# Patient Record
Sex: Female | Born: 1960 | Race: White | Hispanic: No | Marital: Single | Smoking: Current every day smoker
Health system: Southern US, Community
[De-identification: ages and names within clinical notes are randomized; demographics above are authoritative.]

## PROBLEM LIST (undated history)

## (undated) DIAGNOSIS — I509 Heart failure, unspecified: Secondary | ICD-10-CM

## (undated) DIAGNOSIS — IMO0002 Reserved for concepts with insufficient information to code with codable children: Secondary | ICD-10-CM

## (undated) DIAGNOSIS — E0500 Thyrotoxicosis with diffuse goiter without thyrotoxic crisis or storm: Secondary | ICD-10-CM

## (undated) HISTORY — PX: OTHER SURGICAL HISTORY: SHX169

## (undated) HISTORY — DX: Heart failure, unspecified: I50.9

## (undated) HISTORY — PX: CATARACT EXTRACTION, BILATERAL: SHX1313

## (undated) HISTORY — PX: BUNIONECTOMY: SHX129

## (undated) HISTORY — PX: CARPAL TUNNEL RELEASE: SHX101

## (undated) HISTORY — DX: Thyrotoxicosis with diffuse goiter without thyrotoxic crisis or storm: E05.00

---

## 2000-03-10 ENCOUNTER — Encounter: Admission: RE | Admit: 2000-03-10 | Discharge: 2000-03-10 | Payer: Self-pay | Admitting: Gynecology

## 2000-03-10 ENCOUNTER — Encounter: Payer: Self-pay | Admitting: Gynecology

## 2000-04-28 ENCOUNTER — Other Ambulatory Visit: Admission: RE | Admit: 2000-04-28 | Discharge: 2000-04-28 | Payer: Self-pay | Admitting: Gynecology

## 2001-03-11 ENCOUNTER — Encounter: Admission: RE | Admit: 2001-03-11 | Discharge: 2001-03-11 | Payer: Self-pay | Admitting: Gynecology

## 2001-03-11 ENCOUNTER — Encounter: Payer: Self-pay | Admitting: Gynecology

## 2001-03-26 ENCOUNTER — Encounter: Payer: Self-pay | Admitting: Emergency Medicine

## 2001-03-26 ENCOUNTER — Emergency Department (HOSPITAL_COMMUNITY): Admission: EM | Admit: 2001-03-26 | Discharge: 2001-03-27 | Payer: Self-pay | Admitting: Emergency Medicine

## 2001-05-05 ENCOUNTER — Other Ambulatory Visit: Admission: RE | Admit: 2001-05-05 | Discharge: 2001-05-05 | Payer: Self-pay | Admitting: Gynecology

## 2001-07-24 ENCOUNTER — Ambulatory Visit (HOSPITAL_COMMUNITY): Admission: RE | Admit: 2001-07-24 | Discharge: 2001-07-24 | Payer: Self-pay | Admitting: Family Medicine

## 2001-07-24 ENCOUNTER — Encounter: Payer: Self-pay | Admitting: Family Medicine

## 2001-08-12 ENCOUNTER — Encounter: Payer: Self-pay | Admitting: Neurosurgery

## 2001-08-12 ENCOUNTER — Observation Stay (HOSPITAL_COMMUNITY): Admission: RE | Admit: 2001-08-12 | Discharge: 2001-08-13 | Payer: Self-pay | Admitting: Neurosurgery

## 2002-04-05 ENCOUNTER — Encounter: Admission: RE | Admit: 2002-04-05 | Discharge: 2002-04-05 | Payer: Self-pay | Admitting: Gynecology

## 2002-04-05 ENCOUNTER — Encounter: Payer: Self-pay | Admitting: Gynecology

## 2002-05-09 ENCOUNTER — Other Ambulatory Visit: Admission: RE | Admit: 2002-05-09 | Discharge: 2002-05-09 | Payer: Self-pay | Admitting: Gynecology

## 2002-10-04 ENCOUNTER — Emergency Department (HOSPITAL_COMMUNITY): Admission: EM | Admit: 2002-10-04 | Discharge: 2002-10-04 | Payer: Self-pay | Admitting: Emergency Medicine

## 2002-11-03 ENCOUNTER — Emergency Department (HOSPITAL_COMMUNITY): Admission: EM | Admit: 2002-11-03 | Discharge: 2002-11-03 | Payer: Self-pay | Admitting: *Deleted

## 2002-11-03 ENCOUNTER — Encounter: Payer: Self-pay | Admitting: *Deleted

## 2002-11-03 ENCOUNTER — Encounter: Payer: Self-pay | Admitting: General Surgery

## 2003-04-13 ENCOUNTER — Encounter: Payer: Self-pay | Admitting: Gynecology

## 2003-04-13 ENCOUNTER — Encounter: Admission: RE | Admit: 2003-04-13 | Discharge: 2003-04-13 | Payer: Self-pay | Admitting: Gynecology

## 2003-05-21 ENCOUNTER — Other Ambulatory Visit: Admission: RE | Admit: 2003-05-21 | Discharge: 2003-05-21 | Payer: Self-pay | Admitting: Gynecology

## 2004-04-18 ENCOUNTER — Encounter: Admission: RE | Admit: 2004-04-18 | Discharge: 2004-04-18 | Payer: Self-pay | Admitting: Gynecology

## 2004-05-21 ENCOUNTER — Other Ambulatory Visit: Admission: RE | Admit: 2004-05-21 | Discharge: 2004-05-21 | Payer: Self-pay | Admitting: Gynecology

## 2005-04-30 ENCOUNTER — Encounter: Admission: RE | Admit: 2005-04-30 | Discharge: 2005-04-30 | Payer: Self-pay | Admitting: Gynecology

## 2005-05-22 ENCOUNTER — Other Ambulatory Visit: Admission: RE | Admit: 2005-05-22 | Discharge: 2005-05-22 | Payer: Self-pay | Admitting: Gynecology

## 2005-09-25 ENCOUNTER — Encounter: Admission: RE | Admit: 2005-09-25 | Discharge: 2005-09-25 | Payer: Self-pay | Admitting: Neurosurgery

## 2005-10-09 ENCOUNTER — Encounter: Admission: RE | Admit: 2005-10-09 | Discharge: 2005-10-09 | Payer: Self-pay | Admitting: Neurosurgery

## 2005-11-18 ENCOUNTER — Encounter: Admission: RE | Admit: 2005-11-18 | Discharge: 2005-11-18 | Payer: Self-pay | Admitting: Neurosurgery

## 2006-05-05 ENCOUNTER — Encounter: Admission: RE | Admit: 2006-05-05 | Discharge: 2006-05-05 | Payer: Self-pay | Admitting: Gynecology

## 2006-06-29 ENCOUNTER — Other Ambulatory Visit: Admission: RE | Admit: 2006-06-29 | Discharge: 2006-06-29 | Payer: Self-pay | Admitting: Gynecology

## 2007-05-10 ENCOUNTER — Encounter: Admission: RE | Admit: 2007-05-10 | Discharge: 2007-05-10 | Payer: Self-pay | Admitting: Gynecology

## 2008-10-31 ENCOUNTER — Inpatient Hospital Stay (HOSPITAL_COMMUNITY): Admission: AC | Admit: 2008-10-31 | Discharge: 2008-11-06 | Payer: Self-pay

## 2008-11-02 ENCOUNTER — Ambulatory Visit: Payer: Self-pay | Admitting: Physical Medicine & Rehabilitation
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
## 2011-04-17 NOTE — H&P (Signed)
[place]  Patient:    [name], [name] Visit Number: [id number] MRN: [id number]          Service Type: Attending:  Tanya Nones. Jeral Fruit, M.D. Dictated by:   Tanya Nones. Jeral Fruit, M.D. Adm. Date:  08/12/01                           History and Physical  HISTORY OF PRESENT ILLNESS:  Ms. Baise is a lady who was seen by me about two weeks ago in my office because of back and left leg pain.  The patient complained of pain several weeks ago but unfortunately, her father was dying and she had to stay at home to help.  The patient got worse and although she had been seen by a chiropractor and taking medications, she is not any better, the pain going to the left leg and she had been out of work.  She denies any problems with the right leg or problem with bladder or bowel.  PAST MEDICAL HISTORY:  Vaginal delivery and carpal tunnel surgery.  ALLERGIES:  She is not allergic to any medication.  SOCIAL HISTORY:  She drinks socially and smokes a pack a day.  REVIEW OF SYSTEMS:  Review of systems is positive for back pain and weakness.  MEDICATIONS:  She had been taking prednisone, Xanax, hydrocodone and Vioxx.  FAMILY HISTORY:  Mother is 32 with breast cancer.  Father died of prostate cancer.  There is a history of high blood pressure and diabetes in her family.  PHYSICAL EXAMINATION:  GENERAL:  A patient who came to my office limping from the left leg and she was crying.  HEENT:  Normal.  NECK:  Normal.  LUNGS:  Clear.  HEART:  Heart sounds normal.  ABDOMEN:  Normal.  EXTREMITIES:  Normal pulses.  NEUROLOGIC:  Mental status normal.  Cranial nerves normal.  Strength is 5/5 except that in the left leg, she has weakness of dorsiflexion, 4/5. Reflexes symmetrical.  Sensation:  She complains of numbness in top of the left foot.  She has a positive sciatic notch ______ on the left side.  IMAGING STUDY:  The MRI showed that indeed she has a herniated disk  at the level of 4-5 going to the foramen, compromising the L5 nerve root.  IMPRESSION:  Left L4-L5 herniated disk.  RECOMMENDATIONS:  The patient wants to proceed with surgery.  The procedure will be an L4-L5 diskectomy using the Matrix system.  If not, we will have to proceed with a conventional surgery.  The risks of course are to a scar, infection, CSF leak, worsening of pain, paralysis and need for further surgery.  Patient declined more opinion. Dictated by:   Tanya Nones. Jeral Fruit, M.D. Attending:  Tanya Nones. Jeral Fruit, M.D. DD:  08/12/01 TD:  08/12/01 Job: [id number]

## 2011-04-17 NOTE — Op Note (Signed)
[place]  Patient:    [name], [name] Visit Number: [id number] MRN: [id number]          Service Type: MED Location: [id number] Attending Physician:  Danella Penton Dictated by:   Tanya Nones. Jeral Fruit, M.D. Proc. Date: 08/12/01 Admit Date:  08/12/2001                             Operative Report  PREOPERATIVE DIAGNOSIS:  Left L4-5 herniated disk into the foramen, affecting the L5 and L4 nerve roots.  POSTOPERATIVE DIAGNOSIS:  Left L4-5 herniated disk into the foramen, affecting the L5 and L4 nerve roots.  PROCEDURE:  Left L4-5 diskectomy, foraminotomy.  Microscope, C-arm, and Met-RX.  SURGEON:  Tanya Nones. Jeral Fruit, M.D.  ASSISTANT:  Payton Doughty, M.D.  CLINICAL HISTORY:  [name] is a 50 year old lady complaining of back and left leg pain associated with weakness.  Patient is getting worse.  Patient came to my office limping and crying.  MRI showed herniated disk at the level of L4-5 going to the foramen.  Surgery was advised.  The risks were explained in the history and physical.  DESCRIPTION OF PROCEDURE:  The patient was taken to the OR, and she was positioned in a prone manner.  The back was prepped with Betadine.  With the C-arm, we were able to localize the facet of 4-5.  Having done this, incision about one inch was made and using the Met-RX dilator, we were able to introduce the largest dilator and find the refractor of six inches deeper.  AP and lateral with the C-arm showed that indeed we were at the level of 4-5. Muscles were removed.  We identified the medial facet of 4-5 as well as the lamina of L5 and 4.  With the drill we drilled the lower part of the lamina of L5 and 4 as well as part of the medial facet.  A thick yellow ligament was also retracted.  We identified the dural sac as well as the L5 nerve root. The L5 nerve root was swollen.  Retraction was made, and indeed lateral there was a large herniated disk.  The  incision was made, and a large amount of degenerative disk medial and lateral was removed.  The procedure was done using microcurettes.  At the end, we have a good decompression of the disk. There was plenty of space of the L4 and L5 nerve root.  There was no evidence of any fragment after investigation above and below.  The only thing we found was a fragment going through ligament into L5.  That was removed at the beginning of the case.  At the end we have a good decompression.  Valsalva maneuver was negative.  Fentanyl and Depo-Medrol were left in the epidural area, and the wound was closed with Vicryl and Steri-Strip. Dictated by:   Tanya Nones. Jeral Fruit, M.D. Attending Physician:  Danella Penton DD:  08/12/01 TD:  08/12/01 Job: [phone] [id number]

## 2011-09-04 LAB — URINALYSIS, ROUTINE W REFLEX MICROSCOPIC
Bilirubin Urine: NEGATIVE
Glucose, UA: NEGATIVE mg/dL
Hgb urine dipstick: NEGATIVE
Ketones, ur: NEGATIVE mg/dL
Nitrite: NEGATIVE
Protein, ur: NEGATIVE mg/dL
Specific Gravity, Urine: 1.025 (ref 1.005–1.030)
Urobilinogen, UA: 0.2 mg/dL (ref 0.0–1.0)
pH: 6 (ref 5.0–8.0)

## 2011-09-04 LAB — CBC
HCT: 32.6 % — ABNORMAL LOW (ref 36.0–46.0)
HCT: 33.8 % — ABNORMAL LOW (ref 36.0–46.0)
HCT: 34.8 % — ABNORMAL LOW (ref 36.0–46.0)
Hemoglobin: 10.9 g/dL — ABNORMAL LOW (ref 12.0–15.0)
Hemoglobin: 11.3 g/dL — ABNORMAL LOW (ref 12.0–15.0)
Hemoglobin: 11.5 g/dL — ABNORMAL LOW (ref 12.0–15.0)
MCHC: 32.9 g/dL (ref 30.0–36.0)
MCHC: 33.5 g/dL (ref 30.0–36.0)
MCHC: 33.5 g/dL (ref 30.0–36.0)
MCV: 97.1 fL (ref 78.0–100.0)
MCV: 97.5 fL (ref 78.0–100.0)
MCV: 98.1 fL (ref 78.0–100.0)
Platelets: 181 10*3/uL (ref 150–400)
Platelets: 196 10*3/uL (ref 150–400)
Platelets: 213 10*3/uL (ref 150–400)
RBC: 3.34 MIL/uL — ABNORMAL LOW (ref 3.87–5.11)
RBC: 3.48 MIL/uL — ABNORMAL LOW (ref 3.87–5.11)
RBC: 3.55 MIL/uL — ABNORMAL LOW (ref 3.87–5.11)
RDW: 12.2 % (ref 11.5–15.5)
RDW: 12.4 % (ref 11.5–15.5)
RDW: 12.4 % (ref 11.5–15.5)
WBC: 11.1 10*3/uL — ABNORMAL HIGH (ref 4.0–10.5)
WBC: 11.2 10*3/uL — ABNORMAL HIGH (ref 4.0–10.5)
WBC: 13.6 10*3/uL — ABNORMAL HIGH (ref 4.0–10.5)

## 2011-09-04 LAB — BASIC METABOLIC PANEL
BUN: 3 mg/dL — ABNORMAL LOW (ref 6–23)
CO2: 28 mEq/L (ref 19–32)
Calcium: 8.3 mg/dL — ABNORMAL LOW (ref 8.4–10.5)
Chloride: 104 mEq/L (ref 96–112)
Creatinine, Ser: 0.54 mg/dL (ref 0.4–1.2)
GFR calc Af Amer: >60 mL/min (ref >60)
GFR calc non Af Amer: >60 mL/min (ref >60)
Glucose, Bld: 113 mg/dL — ABNORMAL HIGH (ref 70–99)
Potassium: 3.1 mEq/L — ABNORMAL LOW (ref 3.5–5.1)
Sodium: 138 mEq/L (ref 135–145)

## 2011-09-04 LAB — ETHANOL: Alcohol, Ethyl (B): <5 mg/dL (ref 0–10)

## 2011-09-04 LAB — DIFFERENTIAL
Basophils Absolute: 0 10*3/uL (ref 0.0–0.1)
Basophils Relative: 0 % (ref 0–1)
Eosinophils Absolute: 0.2 10*3/uL (ref 0.0–0.7)
Eosinophils Relative: 1 % (ref 0–5)
Lymphocytes Relative: 36 % (ref 12–46)
Lymphs Abs: 4 10*3/uL (ref 0.7–4.0)
Monocytes Absolute: 0.6 10*3/uL (ref 0.1–1.0)
Monocytes Relative: 5 % (ref 3–12)
Neutro Abs: 6.4 10*3/uL (ref 1.7–7.7)
Neutrophils Relative %: 58 % (ref 43–77)

## 2011-09-04 LAB — RAPID URINE DRUG SCREEN, HOSP PERFORMED
Amphetamines: NOT DETECTED
Barbiturates: NOT DETECTED
Benzodiazepines: NOT DETECTED
Cocaine: NOT DETECTED
Opiates: POSITIVE — AB
Tetrahydrocannabinol: NOT DETECTED

## 2011-09-04 LAB — POCT I-STAT, CHEM 8
BUN: 8 mg/dL (ref 6–23)
Calcium, Ion: 0.99 mmol/L — ABNORMAL LOW (ref 1.12–1.32)
Chloride: 112 mEq/L (ref 96–112)
Creatinine, Ser: 0.7 mg/dL (ref 0.4–1.2)
Glucose, Bld: 81 mg/dL (ref 70–99)
HCT: 34 % — ABNORMAL LOW (ref 36.0–46.0)
Hemoglobin: 11.6 g/dL — ABNORMAL LOW (ref 12.0–15.0)
Potassium: 2.6 meq/L — CL (ref 3.5–5.1)
Sodium: 145 mEq/L (ref 135–145)
TCO2: 19 mmol/L (ref 0–100)

## 2014-07-10 ENCOUNTER — Emergency Department (HOSPITAL_COMMUNITY)
Admission: EM | Admit: 2014-07-10 | Discharge: 2014-07-10 | Disposition: A | Discharge status: Home / Self Care | Payer: Commercial Managed Care - PPO | Attending: Emergency Medicine | Admitting: Emergency Medicine

## 2014-07-10 ENCOUNTER — Encounter (HOSPITAL_COMMUNITY): Payer: Self-pay | Admitting: Emergency Medicine

## 2014-07-10 DIAGNOSIS — Y92410 Unspecified street and highway as the place of occurrence of the external cause: Secondary | ICD-10-CM | POA: Insufficient documentation

## 2014-07-10 DIAGNOSIS — G8929 Other chronic pain: Secondary | ICD-10-CM | POA: Diagnosis not present

## 2014-07-10 DIAGNOSIS — S39012A Strain of muscle, fascia and tendon of lower back, initial encounter: Secondary | ICD-10-CM

## 2014-07-10 DIAGNOSIS — S335XXA Sprain of ligaments of lumbar spine, initial encounter: Secondary | ICD-10-CM | POA: Diagnosis not present

## 2014-07-10 DIAGNOSIS — Y9389 Activity, other specified: Secondary | ICD-10-CM | POA: Insufficient documentation

## 2014-07-10 DIAGNOSIS — IMO0002 Reserved for concepts with insufficient information to code with codable children: Secondary | ICD-10-CM | POA: Diagnosis present

## 2014-07-10 DIAGNOSIS — M549 Dorsalgia, unspecified: Secondary | ICD-10-CM

## 2014-07-10 HISTORY — DX: Reserved for concepts with insufficient information to code with codable children: IMO0002

## 2014-07-10 MED ORDER — CYCLOBENZAPRINE HCL 10 MG PO TABS: 10.0000 mg | ORAL_TABLET | Freq: Three times a day (TID) | ORAL | Status: DC | PRN

## 2014-07-10 MED ORDER — HYDROCODONE-ACETAMINOPHEN 5-325 MG PO TABS: 1.0000 | ORAL_TABLET | ORAL | Status: DC | PRN

## 2014-07-10 MED ORDER — MELOXICAM 15 MG PO TABS: 15.0000 mg | ORAL_TABLET | Freq: Every day | ORAL | Status: DC
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
## 2014-07-17 NOTE — ED Provider Notes (Signed)
Medical screening examination/treatment/procedure(s) were performed by non-physician practitioner and as supervising physician I was immediately available for consultation/collaboration.     Suzi RootsKevin E Thorn Demas, MD 07/17/14 [phone]

## 2017-02-22 ENCOUNTER — Ambulatory Visit (HOSPITAL_COMMUNITY)
Admission: EM | Admit: 2017-02-22 | Discharge: 2017-02-22 | Disposition: A | Discharge status: Other Acute Inpatient Hospital | Payer: Managed Care, Other (non HMO)

## 2017-02-23 ENCOUNTER — Inpatient Hospital Stay (HOSPITAL_COMMUNITY)
Admission: EM | Admit: 2017-02-23 | Discharge: 2017-02-26 | DRG: 291 | Disposition: A | Discharge status: Home / Self Care | Payer: Managed Care, Other (non HMO) | Attending: Family Medicine | Admitting: Family Medicine

## 2017-02-23 ENCOUNTER — Emergency Department (HOSPITAL_COMMUNITY): Payer: Managed Care, Other (non HMO)

## 2017-02-23 ENCOUNTER — Encounter (HOSPITAL_COMMUNITY): Payer: Self-pay

## 2017-02-23 DIAGNOSIS — R0600 Dyspnea, unspecified: Secondary | ICD-10-CM

## 2017-02-23 DIAGNOSIS — J180 Bronchopneumonia, unspecified organism: Secondary | ICD-10-CM | POA: Diagnosis not present

## 2017-02-23 DIAGNOSIS — J189 Pneumonia, unspecified organism: Secondary | ICD-10-CM

## 2017-02-23 DIAGNOSIS — E785 Hyperlipidemia, unspecified: Secondary | ICD-10-CM | POA: Diagnosis present

## 2017-02-23 DIAGNOSIS — E876 Hypokalemia: Secondary | ICD-10-CM | POA: Diagnosis present

## 2017-02-23 DIAGNOSIS — R05 Cough: Secondary | ICD-10-CM | POA: Diagnosis not present

## 2017-02-23 DIAGNOSIS — R600 Localized edema: Secondary | ICD-10-CM

## 2017-02-23 DIAGNOSIS — J302 Other seasonal allergic rhinitis: Secondary | ICD-10-CM | POA: Diagnosis present

## 2017-02-23 DIAGNOSIS — I509 Heart failure, unspecified: Secondary | ICD-10-CM | POA: Diagnosis not present

## 2017-02-23 DIAGNOSIS — B9729 Other coronavirus as the cause of diseases classified elsewhere: Secondary | ICD-10-CM | POA: Diagnosis present

## 2017-02-23 DIAGNOSIS — F17200 Nicotine dependence, unspecified, uncomplicated: Secondary | ICD-10-CM | POA: Diagnosis present

## 2017-02-23 DIAGNOSIS — I5021 Acute systolic (congestive) heart failure: Secondary | ICD-10-CM | POA: Diagnosis not present

## 2017-02-23 DIAGNOSIS — J440 Chronic obstructive pulmonary disease with acute lower respiratory infection: Secondary | ICD-10-CM | POA: Diagnosis present

## 2017-02-23 DIAGNOSIS — I700 Atherosclerosis of aorta: Secondary | ICD-10-CM | POA: Diagnosis present

## 2017-02-23 DIAGNOSIS — I5023 Acute on chronic systolic (congestive) heart failure: Secondary | ICD-10-CM | POA: Diagnosis not present

## 2017-02-23 DIAGNOSIS — J441 Chronic obstructive pulmonary disease with (acute) exacerbation: Secondary | ICD-10-CM | POA: Diagnosis present

## 2017-02-23 DIAGNOSIS — R748 Abnormal levels of other serum enzymes: Secondary | ICD-10-CM

## 2017-02-23 DIAGNOSIS — R7989 Other specified abnormal findings of blood chemistry: Secondary | ICD-10-CM

## 2017-02-23 DIAGNOSIS — J9601 Acute respiratory failure with hypoxia: Secondary | ICD-10-CM | POA: Diagnosis present

## 2017-02-23 DIAGNOSIS — R042 Hemoptysis: Secondary | ICD-10-CM | POA: Diagnosis present

## 2017-02-23 DIAGNOSIS — E010 Iodine-deficiency related diffuse (endemic) goiter: Secondary | ICD-10-CM | POA: Diagnosis present

## 2017-02-23 DIAGNOSIS — R0609 Other forms of dyspnea: Secondary | ICD-10-CM

## 2017-02-23 DIAGNOSIS — R059 Cough, unspecified: Secondary | ICD-10-CM

## 2017-02-23 DIAGNOSIS — R0602 Shortness of breath: Secondary | ICD-10-CM | POA: Diagnosis not present

## 2017-02-23 DIAGNOSIS — J439 Emphysema, unspecified: Secondary | ICD-10-CM

## 2017-02-23 DIAGNOSIS — B342 Coronavirus infection, unspecified: Secondary | ICD-10-CM

## 2017-02-23 DIAGNOSIS — J181 Lobar pneumonia, unspecified organism: Secondary | ICD-10-CM

## 2017-02-23 DIAGNOSIS — D649 Anemia, unspecified: Secondary | ICD-10-CM | POA: Diagnosis not present

## 2017-02-23 DIAGNOSIS — Z888 Allergy status to other drugs, medicaments and biological substances status: Secondary | ICD-10-CM | POA: Diagnosis not present

## 2017-02-23 LAB — BASIC METABOLIC PANEL
Anion gap: 9 (ref 5–15)
BUN: <5 mg/dL — ABNORMAL LOW (ref 6–20)
CO2: 30 mmol/L (ref 22–32)
Calcium: 8.7 mg/dL — ABNORMAL LOW (ref 8.9–10.3)
Chloride: 103 mmol/L (ref 101–111)
Creatinine, Ser: 0.37 mg/dL — ABNORMAL LOW (ref 0.44–1.00)
GFR calc Af Amer: >60 mL/min (ref >60)
GFR calc non Af Amer: >60 mL/min (ref >60)
Glucose, Bld: 135 mg/dL — ABNORMAL HIGH (ref 65–99)
Potassium: 2.7 mmol/L — CL (ref 3.5–5.1)
Sodium: 142 mmol/L (ref 135–145)

## 2017-02-23 LAB — LIPID PANEL
Cholesterol: 68 mg/dL (ref 0–200)
HDL: 22 mg/dL — ABNORMAL LOW (ref >40)
LDL Cholesterol: 31 mg/dL (ref 0–99)
Total CHOL/HDL Ratio: 3.1 RATIO
Triglycerides: 75 mg/dL (ref <150)
VLDL: 15 mg/dL (ref 0–40)

## 2017-02-23 LAB — COMPREHENSIVE METABOLIC PANEL
ALT: 15 U/L (ref 14–54)
AST: 24 U/L (ref 15–41)
Albumin: 3.1 g/dL — ABNORMAL LOW (ref 3.5–5.0)
Alkaline Phosphatase: 170 U/L — ABNORMAL HIGH (ref 38–126)
Anion gap: 10 (ref 5–15)
BUN: 7 mg/dL (ref 6–20)
CO2: 28 mmol/L (ref 22–32)
Calcium: 8.8 mg/dL — ABNORMAL LOW (ref 8.9–10.3)
Chloride: 104 mmol/L (ref 101–111)
Creatinine, Ser: 0.32 mg/dL — ABNORMAL LOW (ref 0.44–1.00)
GFR calc Af Amer: >60 mL/min (ref >60)
GFR calc non Af Amer: >60 mL/min (ref >60)
Glucose, Bld: 101 mg/dL — ABNORMAL HIGH (ref 65–99)
Potassium: 2.4 mmol/L — CL (ref 3.5–5.1)
Sodium: 142 mmol/L (ref 135–145)
Total Bilirubin: 1.2 mg/dL (ref 0.3–1.2)
Total Protein: 5.6 g/dL — ABNORMAL LOW (ref 6.5–8.1)

## 2017-02-23 LAB — CBC WITH DIFFERENTIAL/PLATELET
Basophils Absolute: 0 10*3/uL (ref 0.0–0.1)
Basophils Relative: 0 %
Eosinophils Absolute: 0 10*3/uL (ref 0.0–0.7)
Eosinophils Relative: 0 %
HCT: 28.6 % — ABNORMAL LOW (ref 36.0–46.0)
Hemoglobin: 9.1 g/dL — ABNORMAL LOW (ref 12.0–15.0)
Lymphocytes Relative: 52 %
Lymphs Abs: 3.7 10*3/uL (ref 0.7–4.0)
MCH: 29.1 pg (ref 26.0–34.0)
MCHC: 31.8 g/dL (ref 30.0–36.0)
MCV: 91.4 fL (ref 78.0–100.0)
Monocytes Absolute: 0.7 10*3/uL (ref 0.1–1.0)
Monocytes Relative: 10 %
Neutro Abs: 2.7 10*3/uL (ref 1.7–7.7)
Neutrophils Relative %: 38 %
Platelets: 150 10*3/uL (ref 150–400)
RBC: 3.13 MIL/uL — ABNORMAL LOW (ref 3.87–5.11)
RDW: 15.2 % (ref 11.5–15.5)
WBC: 7.2 10*3/uL (ref 4.0–10.5)

## 2017-02-23 LAB — URINALYSIS, ROUTINE W REFLEX MICROSCOPIC
Bilirubin Urine: NEGATIVE
Glucose, UA: NEGATIVE mg/dL
Hgb urine dipstick: NEGATIVE
Ketones, ur: NEGATIVE mg/dL
Leukocytes, UA: NEGATIVE
Nitrite: NEGATIVE
Protein, ur: NEGATIVE mg/dL
Specific Gravity, Urine: 1.01 (ref 1.005–1.030)
pH: 7.5 (ref 5.0–8.0)

## 2017-02-23 LAB — TROPONIN I
Troponin I: <0.03 ng/mL (ref <0.03)
Troponin I: <0.03 ng/mL (ref <0.03)

## 2017-02-23 LAB — TSH: TSH: <0.01 u[IU]/mL — ABNORMAL LOW (ref 0.350–4.500)

## 2017-02-23 LAB — MAGNESIUM: Magnesium: 1.6 mg/dL — ABNORMAL LOW (ref 1.7–2.4)

## 2017-02-23 LAB — BRAIN NATRIURETIC PEPTIDE: B Natriuretic Peptide: 1346.4 pg/mL — ABNORMAL HIGH (ref 0.0–100.0)

## 2017-02-23 MED ORDER — ACETAMINOPHEN 650 MG RE SUPP: 650.0000 mg | Freq: Four times a day (QID) | RECTAL | Status: DC | PRN

## 2017-02-23 MED ORDER — DEXTROSE 5 % IV SOLN
1.0000 g | Freq: Once | INTRAVENOUS | Status: AC
  Administered 2017-02-23: 1 g via INTRAVENOUS
  Filled 2017-02-23: qty 10

## 2017-02-23 MED ORDER — DEXTROSE 5 % IV SOLN
1.0000 g | INTRAVENOUS | Status: DC
  Administered 2017-02-24: 1 g via INTRAVENOUS
  Filled 2017-02-23 (×2): qty 10

## 2017-02-23 MED ORDER — DOCUSATE SODIUM 100 MG PO CAPS
100.0000 mg | ORAL_CAPSULE | Freq: Two times a day (BID) | ORAL | Status: DC
  Administered 2017-02-23: 100 mg via ORAL
  Filled 2017-02-23 (×5): qty 1

## 2017-02-23 MED ORDER — POTASSIUM CHLORIDE CRYS ER 20 MEQ PO TBCR
40.0000 meq | EXTENDED_RELEASE_TABLET | Freq: Once | ORAL | Status: AC
  Administered 2017-02-23: 40 meq via ORAL
  Filled 2017-02-23: qty 2

## 2017-02-23 MED ORDER — IOPAMIDOL (ISOVUE-370) INJECTION 76%
INTRAVENOUS | Status: AC
  Administered 2017-02-23: 100 mL
  Filled 2017-02-23: qty 100

## 2017-02-23 MED ORDER — LORATADINE 10 MG PO TABS
10.0000 mg | ORAL_TABLET | Freq: Every day | ORAL | Status: DC
  Administered 2017-02-24 – 2017-02-26 (×3): 10 mg via ORAL
  Filled 2017-02-23 (×3): qty 1

## 2017-02-23 MED ORDER — DEXTROSE 5 % IV SOLN
500.0000 mg | INTRAVENOUS | Status: DC
  Administered 2017-02-24: 500 mg via INTRAVENOUS
  Filled 2017-02-23 (×2): qty 500

## 2017-02-23 MED ORDER — ACETAMINOPHEN 325 MG PO TABS: 650.0000 mg | ORAL_TABLET | Freq: Four times a day (QID) | ORAL | Status: DC | PRN

## 2017-02-23 MED ORDER — ENOXAPARIN SODIUM 40 MG/0.4ML ~~LOC~~ SOLN
40.0000 mg | SUBCUTANEOUS | Status: DC
  Administered 2017-02-25: 40 mg via SUBCUTANEOUS
  Filled 2017-02-23 (×2): qty 0.4

## 2017-02-23 MED ORDER — DEXTROSE 5 % IV SOLN
500.0000 mg | Freq: Once | INTRAVENOUS | Status: AC
  Administered 2017-02-23: 500 mg via INTRAVENOUS
  Filled 2017-02-23: qty 500

## 2017-02-23 MED ORDER — FAMOTIDINE 20 MG PO TABS
10.0000 mg | ORAL_TABLET | Freq: Every day | ORAL | Status: DC
  Administered 2017-02-24 – 2017-02-26 (×3): 10 mg via ORAL
  Filled 2017-02-23 (×3): qty 1

## 2017-02-23 MED ORDER — POTASSIUM CHLORIDE CRYS ER 20 MEQ PO TBCR
40.0000 meq | EXTENDED_RELEASE_TABLET | Freq: Three times a day (TID) | ORAL | Status: AC
  Administered 2017-02-23 – 2017-02-24 (×2): 40 meq via ORAL
  Filled 2017-02-23 (×2): qty 2

## 2017-02-23 MED ORDER — FUROSEMIDE 10 MG/ML IJ SOLN
40.0000 mg | Freq: Once | INTRAMUSCULAR | Status: AC
Start: 2017-02-23 — End: 2017-02-23
  Administered 2017-02-23: 40 mg via INTRAVENOUS
  Filled 2017-02-23: qty 4

## 2017-02-23 MED ORDER — IPRATROPIUM-ALBUTEROL 0.5-2.5 (3) MG/3ML IN SOLN
3.0000 mL | RESPIRATORY_TRACT | Status: DC
  Administered 2017-02-23 – 2017-02-24 (×3): 3 mL via RESPIRATORY_TRACT
  Filled 2017-02-23 (×3): qty 3

## 2017-02-23 MED ORDER — ALBUTEROL SULFATE (2.5 MG/3ML) 0.083% IN NEBU: 2.5000 mg | INHALATION_SOLUTION | RESPIRATORY_TRACT | Status: DC | PRN

## 2017-02-23 MED ORDER — SODIUM CHLORIDE 0.9% FLUSH
3.0000 mL | Freq: Two times a day (BID) | INTRAVENOUS | Status: DC
  Administered 2017-02-23 – 2017-02-26 (×6): 3 mL via INTRAVENOUS

## 2017-02-23 MED ORDER — SODIUM CHLORIDE 0.9 % IV SOLN
30.0000 meq | Freq: Once | INTRAVENOUS | Status: AC
  Administered 2017-02-23: 30 meq via INTRAVENOUS
  Filled 2017-02-23: qty 15

## 2017-02-23 MED ORDER — PROMETHAZINE HCL 25 MG PO TABS: 12.5000 mg | ORAL_TABLET | Freq: Four times a day (QID) | ORAL | Status: DC | PRN

## 2017-02-23 NOTE — ED Notes (Signed)
Patient is stable and ready to be transport to the floor at this time.  Report was called to 3E RN.  Belongings taken with the patient to the floor.   

## 2017-02-23 NOTE — Progress Notes (Signed)
CRITICAL VALUE ALERT  Critical value received:  Potassium 2.7  Date of notification:  02/23/2017  Time of notification:  1953  Critical value read back: Yes  Nurse who received alert:  Petra KubaErica Seaborn Nakama  MD notified (1st page):  Family Medicine Pager  Time of first page:  1954  Time MD responded:  2005

## 2017-02-23 NOTE — ED Notes (Signed)
Notified Ebbie Ridgehris Lawyer, [place]PA of K of 2.4

## 2017-02-23 NOTE — ED Triage Notes (Signed)
Pt states increased shortness of breath for several days. She also has notable swelling bilaterally to the feet. Pt shortness of breath worse with exertion. EKG done. SPO2 98%

## 2017-02-23 NOTE — H&P (Signed)
[facility] Admission History and Physical Service Pager: [phone]  Patient name: [name] Medical record number: [id number] Date of birth: [birth year] Age: 56 y.o. Gender: female  Primary Care Provider: No PCP Per Patient Consultants: None  Code Status: FULL   Chief Complaint: shortness of breath   Assessment and Plan: LASHEENA FRIEZE is a 56 y.o. female presenting with shortness of breath.  With no known significant PMHx but has not seen a doctor in >10 years.    Shortness of breath: Likely 2/2 new developing CHF.  Patient with new onset shortness of breath on exertion x 1 week with associated bilateral leg swelling.  On arrival to ED, VSS and afebrile with no white count.  Satting 100% on RA.  Labs notable for hypokalemia to 2.4, and elevated alk phos of 170 but otherwise unremarkable. UA with no findings concerning for infection.   Also with associated productive cough occasionally streaked with blood.  No fevers at home and has only taken Mucinex for this cough.  CXR revealed pulmonary vascular congestion with peripheral wedge-shaped opacity in RUL.  Also with apical parenchymal changes and minimal nodularity in right suprahilar region.  In setting of history of smoking, chest CT recommended to determine if underlying malignancy.  CTA was ordered which revealed no evidence of acute PE but did show cardiomegaly, possible mild interstitial edema with trace fusions and RUL peribronchial nodularity in confluent ground-glass opacity most likely bronchopneumonia. Concern for infiltrate vs malignancy due to significant smoking history.  Discussed case with Dr. Nevada Crane (Radiology) regarding if CTA would rule out malignancy and if CT chest should be obtained.  Noted that unlikely malignancy and should treat bronchopneumonia and fluid overload for now with follow up CXR in 3 weeks to check if opacity has resolved.  Differentials include new CHF vs PE.  PE unlikely given Wells  score 0 , no history of long period of immobilization or DVT and negative CTA obtained in ED.  ACS unlikely given no chest pain but must be ruled out given female with new shortness of breath and signs of fluid overload.  EKG with Q waves in II, III, V5 and V6 and some LA enlargement but no acute ST segment changes.  Leading differential is new CHF in setting of BNP elevated to 1346, dyspnea on exertion and obvious signs of fluid overload on exam.  Orthopnea and PND is also concerning for CHF. No prior echo obtained.  Patient also likely has component of underlying lung disease given her substantial tobacco use history. Previous imaging dating back to 2009 has shown emphysematous changes.  -Admit to telemetry, attending Dr. Nori Riis  -Continuous cardiac monitoring -Continuous pulse ox  -Give Lasix 40 mg x1 now and re-assess need for further diuresis -AM BMET  -AM EKG  -Trend troponins  -Echocardiogram  -Risk stratification labs ordered: TSH, A1c, lipid panel  -Scheduled duonebs Q4 -Albuterol neb Q1 prn  -RVP pending -droplet precaution  -precautions -Sputum cx  -Monitor fluid status -Tylenol 650 mg Q6 prn -Phenergan Q6 prn   -I's and O's  -Daily weights -would likely benefit from PFTs outpatient  -hold off on prednisone for now given potential for volume retention in the setting of attempted diuresis; re-evaluate in AM or if respiratory status worsens   RUL PNA on CTA.  Patient afebrile and with no white count.  Started on CTX and AZT in ED.  Will need follow up CXR in 3 weeks to check for resolution of opacity  -  Continue antibiotics  -monitor for fevers -Will obtain sputum cx given concern for post obstructive pneumonia.   -obtain pro-calcitonin and urinary antigens   Hypokalemia.  On arrival, K+ of 2.4.  Given K-run 30 mEq + 40 K-dur x1 in ED.  -Recheck BMET at [place] given risk for worsened hypokalemia in setting of diuretic use  -Repeat AM BMET  -replete as necessary  Anemia:  Normocytic. Hgb 9.1 at admission. Prior values in 2009 around 11. Patient endorses some minimal blood streaked sputum but no active bleeding present at admission. Given patient has not been followed medically for several years, is likely not up to date on cancer screenings.  -repeat CBC in AM  -iron panel  -FOBT -discuss cancer screening and malignancy symptoms with patient in AM   Elevated Alk Phos: 170 at admission. No prior value available for comparison. LFTs WNL so less likely to be hepatic in nature. May be related to Vit D deficiency as patient reports history of this. Potentially related to thyroid disorder. Parathyroid disorder less likely given corrected Ca for low albumin within normal range.  -Vit D, TSH, and GGT ordered   Atherosclerosis: Present in aorta on CTA chest at admission. CT abdomen from 2009 revealed moderate abdominal aortic atherosclerosis, somewhat advanced for age. Patient would likely benefit from high intensity statin given these findings and history of tobacco use.  -obtain lipid panel for further risk stratification   Thyromegaly: Present on CTA chest. New finding since previous imaging in 2009.  -TSH, will order T3/T4 if abnormal   Prolonged QT .  On EKG obtained in ED noted to be 470.   -Avoid medications that prolong QT   Seasonal allergies:  At home on Claritin.  -Continue Claritin 10 mg daily    FEN/GI: Regular, SLIV Prophylaxis: Lovenox  Disposition: Admit to telemetry, attending Dr. Nori Riis   History of Present Illness:  [name] is a 56 y.o. female presenting with shortness of breath   Review Of Systems: Per HPI with the following additions:   Complaining of bilateral legs and feet swelling for past few months. At first leg swelling would go down but now it is persisting.  Has also had a cough and headache. SOB worse with exertion. Has been occurring over the past week. Has tried Mucinex at home. Does not think she has ever had an echo done.  Does not have a regular PCP and has not been to the doctors "in years".   Has cut down to ten cigarettes per day because she couldn't breathe but used to be over a pack a day. Cough has been productive with brown sputum and some streaking of blood present.  Afebrile at home. +Rhinorrhea. Has been waking up in the middle of the night with SOB and has been sleeping in recliner in the middle of the night to help her breathe.   Review of Systems  Constitutional: Negative for chills, fever and weight loss.  HENT: Negative for congestion and sore throat.   Respiratory: Positive for cough, sputum production and shortness of breath.   Cardiovascular: Positive for orthopnea and leg swelling. Negative for chest pain.  Neurological: Negative for weakness.   Patient Active Problem List   Diagnosis Date Noted  . Shortness of breath 02/23/2017  . Elevated brain natriuretic peptide (BNP) level 02/23/2017  . Bronchopneumonia 02/23/2017  . Lower extremity edema 02/23/2017  . Anemia 02/23/2017  . Elevated alkaline phosphatase level 02/23/2017  . Acute systolic congestive heart failure ([place])   .  Community acquired pneumonia of right upper lobe of lung ([place])   . Hypokalemia    Past Medical History: Past Medical History:  Diagnosis Date  . Herniated disc    Past Surgical History: History reviewed. No pertinent surgical history.  Social History: Social History  Substance Use Topics  . Smoking status: Current Every Day Smoker  . Smokeless tobacco: Never Used  . Alcohol use No   Additional social history: Long smoking history. No etoh or drug use. Has custody of 18 month grandson.  Please also refer to relevant sections of EMR.  Family History: History reviewed. No pertinent family history.  Allergies and Medications: Allergies  Allergen Reactions  . Other Rash    Per pt medication to treat MRSA - unsure of name    No current facility-administered medications on file prior to encounter.     Current Outpatient Prescriptions on File Prior to Encounter  Medication Sig Dispense Refill  . cyclobenzaprine (FLEXERIL) 10 MG tablet Take 1 tablet (10 mg total) by mouth 3 (three) times daily as needed for muscle spasms. (Patient not taking: Reported on 02/23/2017) 30 tablet 0  . HYDROcodone-acetaminophen (NORCO/VICODIN) 5-325 MG per tablet Take 1 tablet by mouth every 4 (four) hours as needed for moderate pain. (Patient not taking: Reported on 02/23/2017) 10 tablet 0  . meloxicam (MOBIC) 15 MG tablet Take 1 tablet (15 mg total) by mouth daily. (Patient not taking: Reported on 02/23/2017) 20 tablet 0   Objective: BP (!) 146/57 (BP Location: Right Arm)   Pulse (!) 102   Temp 98.8 F (37.1 C) (Oral)   Resp 18   Wt 135 lb (61.2 kg)   SpO2 94%  Exam: General: 56 year old female sitting in hospital bed, in NAD, thin and somewhat chronically ill appearing  Eyes: PERRL, EOMI ENTM: MMM, oropharynx clear Neck: supple, no JVD  Cardiovascular: RRR no MRG, palpable pulses Respiratory: poor air movement noted on exam, decreased breath sounds over right lung, scattered wheezing, mild increase in work of breathing on RA, speaking in full sentences  Gastrointestinal: soft, NT, ND, +bs MSK: ROM normal Derm: skin is warm and dry Neuro: AAOx3, no focal deficits, 5/5 strength in upper and lower extremities bilaterally Psych: normal affect   Labs and Imaging: CBC BMET   Recent Labs Lab 02/23/17 0930  WBC 7.2  HGB 9.1*  HCT 28.6*  PLT 150    Recent Labs Lab 02/23/17 0930  NA 142  K 2.4*  CL 104  CO2 28  BUN 7  CREATININE 0.32*  GLUCOSE 101*  CALCIUM 8.8*     Dg Chest 2 View  Result Date: 02/23/2017 CLINICAL DATA:  56 year old female with shortness of breath. Smoker. Initial encounter. EXAM: CHEST  2 VIEW COMPARISON:  None. FINDINGS: Biapical changes suggestion of a bullae and scarring. Underlying mass (particularly right lung apex) cannot be excluded. Fullness right hilum possibly  vascular in origin. Mass not excluded. Pulmonary vascular congestion most prominent centrally. Fissure thickening and Kerley B lines may represent result of mild congestive heart. Peripheral wedge-shaped opacity right upper lobe. Subtle infiltrate not excluded. Heart size top-normal. Calcified slightly tortuous aorta. No acute osseous abnormality. IMPRESSION: Pulmonary vascular congestion most prominent centrally. Fissure thickening and Kerley B lines may represent result of mild congestive heart. Peripheral wedge-shaped opacity right upper lobe. Subtle infiltrate not excluded. Apical parenchymal changes, minimal nodularity right suprahilar region and slightly nodular appearance of right hilum. Given patient's history of smoking and lack of comparisons, chest CT recommended  to determine if there is an underlying malignancy contributing to findings. Aortic atherosclerosis. Electronically Signed   By: Genia Del M.D.   On: 02/23/2017 09:05   Ct Angio Chest Pe W/cm &/or Wo Cm  Result Date: 02/23/2017 CLINICAL DATA:  55 year old female with shortness of breath and leg swelling for 1 week. Chest radiographs today suggesting interstitial edema but also with nonspecific wedge-shaped opacity. EXAM: CT ANGIOGRAPHY CHEST WITH CONTRAST TECHNIQUE: Multidetector CT imaging of the chest was performed using the standard protocol during bolus administration of intravenous contrast. Multiplanar CT image reconstructions and MIPs were obtained to evaluate the vascular anatomy. CONTRAST:  100 mL Isovue 370 COMPARISON:  Chest radiographs 0846 hours today. Chest CT 10/31/2008. FINDINGS: Cardiovascular: Adequate contrast bolus timing in the pulmonary arterial tree. Mild respiratory motion artifact. No focal filling defect identified in the pulmonary arteries to suggest acute pulmonary embolism. Mild cardiomegaly. No pericardial effusion. Mild Calcified aortic atherosclerosis. Mediastinum/Nodes: Thyromegaly, new since 2009. No  airway narrowing at the thoracic inlet. No mediastinal or hilar lymphadenopathy, although there is suggestion of some generalized mediastinal soft tissue edema. Lungs/Pleura: Major airways are patent except for atelectatic changes at the hila. Chronic upper lobe paraseptal and centrilobular emphysema and confluent apical scarring. Nodular peribronchial opacity in the posterior right upper lobe with confluent ground-glass opacity along the minor fissure. No other confluent pulmonary opacity. Possible trace pleural effusions. Upper Abdomen: Negative. Musculoskeletal: Mild upper thoracic superior endplate cone cavity appears chronic. No acute osseous abnormality identified. Review of the MIP images confirms the above findings. IMPRESSION: 1.  No evidence of acute pulmonary embolus. 2. There is cardiomegaly - and possibly mild interstitial edema with trace fusions - but right upper lobe peribronchial nodularity in confluent ground-glass opacity most resembles acute Bronchopneumonia. 3. Underlying upper lobe emphysema. 4. Mild Calcified aortic atherosclerosis. 5. Thyromegaly, new since 2009.  Query thyroid hormone abnormality. Electronically Signed   By: Genevie Ann M.D.   On: 02/23/2017 11:12    Lovenia Kim, MD 02/23/2017, 5:44 PM PGY-1, [place] Intern pager: [phone], text pages welcome  Upper Level Addendum:  I have seen and evaluated this patient along with Dr. Reesa Chew and reviewed the above note, making necessary revisions in green.   Phill Myron, D.O. 02/23/2017, 6:25 PM PGY-2, [place]

## 2017-02-23 NOTE — Progress Notes (Signed)
Pt stated she is spitting up tan sputum. Sputum collection cup given to pt and explained.  Sharnetta Gielow Elige RadonBradley

## 2017-02-24 ENCOUNTER — Inpatient Hospital Stay (HOSPITAL_COMMUNITY): Payer: Managed Care, Other (non HMO)

## 2017-02-24 ENCOUNTER — Other Ambulatory Visit (HOSPITAL_COMMUNITY): Payer: Commercial Managed Care - PPO

## 2017-02-24 LAB — GAMMA GT: GGT: 78 U/L — ABNORMAL HIGH (ref 7–50)

## 2017-02-24 LAB — CBC
HCT: 27.5 % — ABNORMAL LOW (ref 36.0–46.0)
Hemoglobin: 8.8 g/dL — ABNORMAL LOW (ref 12.0–15.0)
MCH: 29.1 pg (ref 26.0–34.0)
MCHC: 32 g/dL (ref 30.0–36.0)
MCV: 91.1 fL (ref 78.0–100.0)
Platelets: 147 10*3/uL — ABNORMAL LOW (ref 150–400)
RBC: 3.02 MIL/uL — ABNORMAL LOW (ref 3.87–5.11)
RDW: 15.8 % — ABNORMAL HIGH (ref 11.5–15.5)
WBC: 6.5 10*3/uL (ref 4.0–10.5)

## 2017-02-24 LAB — T4, FREE: Free T4: 5.24 ng/dL — ABNORMAL HIGH (ref 0.61–1.12)

## 2017-02-24 LAB — FERRITIN: Ferritin: 27 ng/mL (ref 11–307)

## 2017-02-24 LAB — RESPIRATORY PANEL BY PCR

## 2017-02-24 LAB — IRON AND TIBC
Iron: 38 ug/dL (ref 28–170)
Saturation Ratios: 13 % (ref 10.4–31.8)
TIBC: 294 ug/dL (ref 250–450)
UIBC: 256 ug/dL

## 2017-02-24 LAB — BASIC METABOLIC PANEL
Anion gap: 11 (ref 5–15)
BUN: 7 mg/dL (ref 6–20)
CO2: 31 mmol/L (ref 22–32)
Calcium: 9 mg/dL (ref 8.9–10.3)
Chloride: 103 mmol/L (ref 101–111)
Creatinine, Ser: 0.34 mg/dL — ABNORMAL LOW (ref 0.44–1.00)
GFR calc Af Amer: >60 mL/min (ref >60)
GFR calc non Af Amer: >60 mL/min (ref >60)
Glucose, Bld: 94 mg/dL (ref 65–99)
Potassium: 3.4 mmol/L — ABNORMAL LOW (ref 3.5–5.1)
Sodium: 145 mmol/L (ref 135–145)

## 2017-02-24 LAB — GLUCOSE, CAPILLARY
Glucose-Capillary: 112 mg/dL — ABNORMAL HIGH (ref 65–99)
Glucose-Capillary: 163 mg/dL — ABNORMAL HIGH (ref 65–99)
Glucose-Capillary: 113 mg/dL — ABNORMAL HIGH (ref 65–99)

## 2017-02-24 LAB — HEMOGLOBIN A1C
Hgb A1c MFr Bld: 5 % (ref 4.8–5.6)
Mean Plasma Glucose: 97 mg/dL

## 2017-02-24 LAB — LACTIC ACID, PLASMA: Lactic Acid, Venous: 1.8 mmol/L (ref 0.5–1.9)

## 2017-02-24 LAB — TROPONIN I: Troponin I: <0.03 ng/mL (ref <0.03)

## 2017-02-24 LAB — MAGNESIUM: Magnesium: 2 mg/dL (ref 1.7–2.4)

## 2017-02-24 LAB — PROCALCITONIN: Procalcitonin: <0.1 ng/mL

## 2017-02-24 LAB — STREP PNEUMONIAE URINARY ANTIGEN: Strep Pneumo Urinary Antigen: NEGATIVE

## 2017-02-24 LAB — HIV ANTIBODY (ROUTINE TESTING W REFLEX): HIV Screen 4th Generation wRfx: NONREACTIVE

## 2017-02-24 LAB — C-REACTIVE PROTEIN: CRP: <0.8 mg/dL (ref <1.0)

## 2017-02-24 MED ORDER — IPRATROPIUM-ALBUTEROL 0.5-2.5 (3) MG/3ML IN SOLN
3.0000 mL | RESPIRATORY_TRACT | Status: DC | PRN
  Administered 2017-02-25: 3 mL via RESPIRATORY_TRACT
  Filled 2017-02-24: qty 3

## 2017-02-24 MED ORDER — MAGNESIUM SULFATE 2 GM/50ML IV SOLN
2.0000 g | Freq: Once | INTRAVENOUS | Status: AC
  Administered 2017-02-24: 2 g via INTRAVENOUS
  Filled 2017-02-24: qty 50

## 2017-02-24 MED ORDER — ASPIRIN EC 81 MG PO TBEC
81.0000 mg | DELAYED_RELEASE_TABLET | Freq: Every day | ORAL | Status: DC
  Administered 2017-02-24 – 2017-02-26 (×3): 81 mg via ORAL
  Filled 2017-02-24 (×3): qty 1

## 2017-02-24 MED ORDER — ATORVASTATIN CALCIUM 40 MG PO TABS
40.0000 mg | ORAL_TABLET | Freq: Every day | ORAL | Status: DC
  Administered 2017-02-24 – 2017-02-25 (×2): 40 mg via ORAL
  Filled 2017-02-24 (×2): qty 1

## 2017-02-24 MED ORDER — POTASSIUM CHLORIDE CRYS ER 20 MEQ PO TBCR
40.0000 meq | EXTENDED_RELEASE_TABLET | Freq: Two times a day (BID) | ORAL | Status: AC
  Administered 2017-02-24 (×2): 40 meq via ORAL
  Filled 2017-02-24 (×2): qty 2

## 2017-02-24 NOTE — Progress Notes (Signed)
Family Medicine Teaching Service Daily Progress Note Intern Pager: [phone]  Patient name: [name] Medical record number: [id number] Date of birth: 30-Nov-1961 Age: 56 y.o. Gender: female  Primary Care Provider: No PCP Per Patient Consultants: None Code Status: FULL   Pt Overview and Major Events to Date:  Admit 3/27  Assessment and Plan: [name] is a 56 y.o. female presenting with shortness of breath.  With no known significant PMHx but has not seen a doctor in >10 years.    Shortness of breath: Likely 2/2 new developing CHF.   CXR revealed pulmonary vascular congestion with peripheral wedge-shaped opacity in RUL.  Also with apical parenchymal changes and minimal nodularity in right suprahilar region.  In setting of history of smoking, chest CT recommended to determine if underlying malignancy.  CTA was ordered which revealed no evidence of acute PE but did show cardiomegaly, possible mild interstitial edema with trace fusions and RUL peribronchial nodularity in confluent ground-glass opacity most likely bronchopneumonia. Concern for infiltrate vs malignancy due to significant smoking history.  ACS has been ruled out with negative troponins and no ischemia on EKG. Leading differential is new CHF in setting of BNP elevated to 1346, dyspnea on exertion and obvious signs of fluid overload on exam.  Orthopnea and PND is also concerning for CHF. No prior echo obtained.  Patient also likely has component of underlying lung disease given her substantial tobacco use history. Previous imaging dating back to 2009 has shown emphysematous changes. Given Lasix 40 mg x1 on admission and this AM leg swelling improved significantly.  Patient notes breathing better with Lasix and scheduled duonebs.  -Continuous cardiac monitoring -Continuous pulse ox  -Echocardiogram -read pending  -Risk stratification labs ordered: TSH <0.010 low.  T3/T4 ordered and T4 elevated 5.24. A1c 5%, lipid panel wnl and HDL  low, 22.  Lipitor 40 and daily aspirin started.  -Duonebs Q4 prn   -HIV pending  -Tylenol 650 mg Q6 prn -Phenergan Q6 prn   -Monitor fluid status.  I's and O's  -Daily weights -would likely benefit from PFTs outpatient   RUL opacity on CTA.  Patient has remained afebrile and with no white count.  Started on CTX and AZT in ED.  Will treat for CAP in meantime.  Additionally, RVP +for Coronavirus. Consider CXR 4-6 weeks later to check resolution of opacity. Procalcitonin negative.   -Continue antibiotics  -monitor for fevers -Will obtain sputum cx given concern for post obstructive pneumonia.   -urinary antigens pending  -Pulm consulted given CT concerning for malignancy and possible post-obstructive pneumonia.  Dr. Chase Caller recommended no bronchoscopy required at this time.  Likely viral vs autoimmune.  Obtain lactic acid and autoimmune labs: ANCA, CRP, ANA, dsDNA, GBM, RA, CCP, SSA, SSB. Will follow up with results of studies. CCM to see tomorrow.    Hypokalemia.  On arrival, K+ of 2.4.  Given K-run 30 mEq + 40 K-dur x1 in ED. 8 PM BMET with 2.7 so given kdur 40 x2. Mag 1.6 and 2 g mag given.  -Recheck BMET in AM given risk for worsened hypokalemia in setting of diuretic use  -K+ 3.4 this morning.  Give additional 40 kdur x2.  Mag this AM 2.0.  -replete as necessary  Anemia: Normocytic. Hgb 9.1 at admission. Prior values in 2009 around 11. Patient endorses some minimal blood streaked sputum but no active bleeding present at admission. Given patient has not been followed medically for several years, is likely not up to date  on cancer screenings.  -repeat CBC in AM  -iron panel  -FOBT -discuss cancer screening and malignancy symptoms with patient in AM   Elevated Alk Phos: 170 at admission. No prior value available for comparison. LFTs WNL so less likely to be hepatic in nature. May be related to Vit D deficiency as patient reports history of this. Potentially related to thyroid  disorder. Parathyroid disorder less likely given corrected Ca for low albumin within normal range.  -Vit D level ordered -GGT mildly elevated to 78.   Atherosclerosis: Present in aorta on CTA chest at admission. CT abdomen from 2009 revealed moderate abdominal aortic atherosclerosis, somewhat advanced for age. Patient would likely benefit from high intensity statin given these findings and history of tobacco use.  -started on Lipitor 40 mg daily.   -counsel on smoking cessation   Thyromegaly: Present on CTA chest. New finding since previous imaging in 2009.  -TSH <0.010 and T4 elevated to 5.24.   -Obtain thyroid ultrasound   Prolonged QT .  On EKG obtained in ED noted to be 470.   -Avoid medications that prolong QT   Seasonal allergies:  At home on Claritin.  -Continue Claritin 10 mg daily    FEN/GI: Regular, SLIV Prophylaxis: Lovenox   Disposition: Continue treatment with IV antibiotics.   Subjective:  Patient feels much better and is breathing without difficulty.  Leg swelling has improved and is no longer pitting.    Objective: Temp:  [98.4 F (36.9 C)-99 F (37.2 C)] 99 F (37.2 C) (03/28 1202) Pulse Rate:  [98-105] 105 (03/28 1202) Resp:  [16-20] 20 (03/28 1202) BP: (120-146)/(56-87) 126/65 (03/28 1202) SpO2:  [92 %-98 %] 95 % (03/28 1202) Weight:  [118 lb 14.4 oz (53.9 kg)-119 lb 14.4 oz (54.4 kg)] 118 lb 14.4 oz (53.9 kg) (03/28 1779)   Physical Exam: General: 56 year old female sitting in hospital bed, in NAD, thin, chronically ill appearing  Eyes: PERRL, EOMI  ENTM: MMM, o/p clear  Neck: supple, no JVD  Cardiovascular: RRR no MRG, palpable pulses Respiratory:diffusely wheezy, decreased breath sounds over right lung, no increased work of breathing  Gastrointestinal: soft, NT, ND, +bs  Derm: skin is warm and dry Extremities: no edema or tenderness noted  Neuro: AAOx3, no focal deficits, 5/5 strength in upper and lower extremities bilaterally Psych:  normal affect   Laboratory:  Recent Labs Lab 02/23/17 0930 02/24/17 0516  WBC 7.2 6.5  HGB 9.1* 8.8*  HCT 28.6* 27.5*  PLT 150 147*    Recent Labs Lab 02/23/17 0930 02/23/17 1834 02/24/17 0516  NA 142 142 145  K 2.4* 2.7* 3.4*  CL 104 103 103  CO2 '28 30 31  ' BUN 7 <5* 7  CREATININE 0.32* 0.37* 0.34*  CALCIUM 8.8* 8.7* 9.0  PROT 5.6*  --   --   BILITOT 1.2  --   --   ALKPHOS 170*  --   --   ALT 15  --   --   AST 24  --   --   GLUCOSE 101* 135* 94   Imaging/Diagnostic Tests: No results found. Lovenia Kim, MD 02/24/2017, 4:34 PM PGY-1, [place] Intern pager: [phone], text pages welcome

## 2017-02-25 ENCOUNTER — Inpatient Hospital Stay (HOSPITAL_COMMUNITY): Payer: Managed Care, Other (non HMO)

## 2017-02-25 DIAGNOSIS — J439 Emphysema, unspecified: Secondary | ICD-10-CM

## 2017-02-25 DIAGNOSIS — R059 Cough, unspecified: Secondary | ICD-10-CM

## 2017-02-25 DIAGNOSIS — R05 Cough: Secondary | ICD-10-CM

## 2017-02-25 DIAGNOSIS — B342 Coronavirus infection, unspecified: Secondary | ICD-10-CM

## 2017-02-25 DIAGNOSIS — R7989 Other specified abnormal findings of blood chemistry: Secondary | ICD-10-CM

## 2017-02-25 DIAGNOSIS — I509 Heart failure, unspecified: Secondary | ICD-10-CM

## 2017-02-25 DIAGNOSIS — J180 Bronchopneumonia, unspecified organism: Secondary | ICD-10-CM

## 2017-02-25 DIAGNOSIS — I5021 Acute systolic (congestive) heart failure: Secondary | ICD-10-CM

## 2017-02-25 LAB — SJOGRENS SYNDROME-A EXTRACTABLE NUCLEAR ANTIBODY: SSA (Ro) (ENA) Antibody, IgG: <0.2 AI (ref 0.0–0.9)

## 2017-02-25 LAB — GLUCOSE, CAPILLARY
Glucose-Capillary: 203 mg/dL — ABNORMAL HIGH (ref 65–99)
Glucose-Capillary: 87 mg/dL (ref 65–99)
Glucose-Capillary: 109 mg/dL — ABNORMAL HIGH (ref 65–99)
Glucose-Capillary: 108 mg/dL — ABNORMAL HIGH (ref 65–99)
Glucose-Capillary: 96 mg/dL (ref 65–99)

## 2017-02-25 LAB — CBC
HCT: 27.8 % — ABNORMAL LOW (ref 36.0–46.0)
Hemoglobin: 8.8 g/dL — ABNORMAL LOW (ref 12.0–15.0)
MCH: 29.3 pg (ref 26.0–34.0)
MCHC: 31.7 g/dL (ref 30.0–36.0)
MCV: 92.7 fL (ref 78.0–100.0)
Platelets: 143 10*3/uL — ABNORMAL LOW (ref 150–400)
RBC: 3 MIL/uL — ABNORMAL LOW (ref 3.87–5.11)
RDW: 15.8 % — ABNORMAL HIGH (ref 11.5–15.5)
WBC: 8.4 10*3/uL (ref 4.0–10.5)

## 2017-02-25 LAB — BASIC METABOLIC PANEL
Anion gap: 7 (ref 5–15)
BUN: 9 mg/dL (ref 6–20)
CO2: 27 mmol/L (ref 22–32)
Calcium: 9.3 mg/dL (ref 8.9–10.3)
Chloride: 107 mmol/L (ref 101–111)
Creatinine, Ser: 0.35 mg/dL — ABNORMAL LOW (ref 0.44–1.00)
GFR calc Af Amer: >60 mL/min (ref >60)
GFR calc non Af Amer: >60 mL/min (ref >60)
Glucose, Bld: 93 mg/dL (ref 65–99)
Potassium: 4.6 mmol/L (ref 3.5–5.1)
Sodium: 141 mmol/L (ref 135–145)

## 2017-02-25 LAB — ANTINUCLEAR ANTIBODIES, IFA: ANA Ab, IFA: NEGATIVE

## 2017-02-25 LAB — T3: T3, Total: 557 ng/dL — ABNORMAL HIGH (ref 71–180)

## 2017-02-25 LAB — SJOGRENS SYNDROME-B EXTRACTABLE NUCLEAR ANTIBODY: SSB (La) (ENA) Antibody, IgG: <0.2 AI (ref 0.0–0.9)

## 2017-02-25 LAB — ANCA TITERS
Atypical P-ANCA titer: <1:20 {titer}
C-ANCA: <1:20 {titer}
P-ANCA: <1:20 {titer}

## 2017-02-25 LAB — CYCLIC CITRUL PEPTIDE ANTIBODY, IGG/IGA: CCP Antibodies IgG/IgA: 4 units (ref 0–19)

## 2017-02-25 LAB — ECHOCARDIOGRAM COMPLETE
Height: 64 in
Weight: 1932.99 oz

## 2017-02-25 LAB — GLOMERULAR BASEMENT MEMBRANE ANTIBODIES: GBM Ab: 3 units (ref 0–20)

## 2017-02-25 LAB — RHEUMATOID FACTOR: Rheumatoid fact SerPl-aCnc: 10.6 IU/mL (ref 0.0–13.9)

## 2017-02-25 LAB — VITAMIN D 25 HYDROXY (VIT D DEFICIENCY, FRACTURES): Vit D, 25-Hydroxy: 110 ng/mL — ABNORMAL HIGH (ref 30.0–100.0)

## 2017-02-25 LAB — ANTI-DNA ANTIBODY, DOUBLE-STRANDED: ds DNA Ab: 1 IU/mL (ref 0–9)

## 2017-02-25 LAB — PROCALCITONIN: Procalcitonin: <0.1 ng/mL

## 2017-02-25 MED ORDER — HYDROXYZINE HCL 25 MG PO TABS
25.0000 mg | ORAL_TABLET | Freq: Every evening | ORAL | Status: DC | PRN
  Filled 2017-02-25: qty 1

## 2017-02-25 MED ORDER — BUDESONIDE 0.25 MG/2ML IN SUSP
0.2500 mg | Freq: Two times a day (BID) | RESPIRATORY_TRACT | Status: DC
  Administered 2017-02-25 – 2017-02-26 (×3): 0.25 mg via RESPIRATORY_TRACT
  Filled 2017-02-25 (×3): qty 2

## 2017-02-25 MED ORDER — FUROSEMIDE 10 MG/ML IJ SOLN
20.0000 mg | Freq: Once | INTRAMUSCULAR | Status: AC
  Administered 2017-02-25: 20 mg via INTRAVENOUS
  Filled 2017-02-25: qty 2

## 2017-02-25 MED ORDER — AMOXICILLIN 500 MG PO CAPS
500.0000 mg | ORAL_CAPSULE | Freq: Three times a day (TID) | ORAL | Status: DC
  Administered 2017-02-25 – 2017-02-26 (×4): 500 mg via ORAL
  Filled 2017-02-25 (×4): qty 1

## 2017-02-25 MED ORDER — IPRATROPIUM-ALBUTEROL 0.5-2.5 (3) MG/3ML IN SOLN
3.0000 mL | RESPIRATORY_TRACT | Status: DC
  Administered 2017-02-25: 3 mL via RESPIRATORY_TRACT
  Filled 2017-02-25: qty 3

## 2017-02-25 MED ORDER — IPRATROPIUM-ALBUTEROL 0.5-2.5 (3) MG/3ML IN SOLN
3.0000 mL | Freq: Three times a day (TID) | RESPIRATORY_TRACT | Status: DC
  Administered 2017-02-25 – 2017-02-26 (×2): 3 mL via RESPIRATORY_TRACT
  Filled 2017-02-25 (×2): qty 3

## 2017-02-25 MED ORDER — AMOXICILLIN 500 MG PO CAPS: 500.0000 mg | ORAL_CAPSULE | Freq: Three times a day (TID) | ORAL | Status: DC

## 2017-02-25 MED ORDER — AZITHROMYCIN 250 MG PO TABS
250.0000 mg | ORAL_TABLET | Freq: Every day | ORAL | Status: DC
  Administered 2017-02-25 – 2017-02-26 (×2): 250 mg via ORAL
  Filled 2017-02-25 (×2): qty 1

## 2017-02-25 MED ORDER — POTASSIUM CHLORIDE CRYS ER 20 MEQ PO TBCR
20.0000 meq | EXTENDED_RELEASE_TABLET | Freq: Once | ORAL | Status: AC
  Administered 2017-02-25: 20 meq via ORAL
  Filled 2017-02-25: qty 1

## 2017-02-25 MED ORDER — ALBUTEROL SULFATE (2.5 MG/3ML) 0.083% IN NEBU: 2.5000 mg | INHALATION_SOLUTION | RESPIRATORY_TRACT | Status: DC | PRN

## 2017-02-25 NOTE — Assessment & Plan Note (Addendum)
probabluy due to all of above and below  Plan See response to lasix and scheduled nebs If no improveent consider systemic steroids

## 2017-02-25 NOTE — Progress Notes (Signed)
Call to RT to request PRN breathing treatment.  

## 2017-02-25 NOTE — Assessment & Plan Note (Signed)
Primary issue  Plan  - see other sctions  - supportive care

## 2017-02-25 NOTE — Progress Notes (Signed)
Sputum specimen collected.

## 2017-02-25 NOTE — Progress Notes (Signed)
  Echocardiogram 2D Echocardiogram has been performed.  Janalyn HarderWest, Wylee Ogden R 02/25/2017, 8:30 AM

## 2017-02-25 NOTE — Assessment & Plan Note (Signed)
High pretest prob forthis   Plan Lasix x 1 Await echo report After that FPT managment

## 2017-02-25 NOTE — Assessment & Plan Note (Addendum)
Features c/w viral RUL bronchopneumonia of coronavirus PCT normal Urine strep neg  Plan  - see other sections - dc CAP abx -await autoimmune  - need CT chest in 3 months to resultion - pcp can do this

## 2017-02-25 NOTE — Consult Note (Signed)
Name: Janice FasterLisa A Lopez MRN: [id number] DOB: Aug 28, 1961    ADMISSION DATE:  02/23/2017 CONSULTATION DATE:  02/25/2017  REFERRING MD :  Dr. Jennette KettleNeal  CHIEF COMPLAINT:  CAP  HISTORY OF PRESENT ILLNESS:   56 year old female active smoker with no known past medical history. Presents to ED on 3/27 with dyspnea for one week with bilateral leg edema with cough and hemoptysis. Denies fever or recent sick contact. On arrival patient was hemodynamically stable and admitted under Family Medicine Service. During stay patient had a CTA which was negative for PE but revealed a RUL nodularity and ground-glass opacity. RVP positive for coronavirus. PCCM was consulted this morning concerning for continued dyspnea and cough.    SIGNIFICANT EVENTS  3/27 > Presents to ED Dyspnea  3/29 > PCCM consulted   STUDIES:  CXR 3/27 > Pulmonary vascular congestion most prominent centrally, fissure thickening, apical parenchymal changes, minimal nodularity right suprahilar region and slightly nodular appearance of right hilum  CTA Chest 3/27 > Cardiomegaly and possibly mild interstitial edema with trace fusions - but right upper lobe peribronchial nodularity in confluent ground-glass opacity most resembles acute bronchopneumonia, underlying upper lobe emphysema   PAST MEDICAL HISTORY :   has a past medical history of Herniated disc.  has no past surgical history on file. Prior to Admission medications   Medication Sig Start Date End Date Taking? Authorizing Provider  Acetaminophen-Caffeine (EXCEDRIN TENSION HEADACHE) 500-65 MG TABS Take 1 tablet by mouth daily as needed (pain).   Yes Historical Provider, MD  cetirizine (ZYRTEC) 10 MG tablet Take 10 mg by mouth daily.   Yes Historical Provider, MD  cholecalciferol (VITAMIN D) 1000 units tablet Take 1,000 Units by mouth daily.   Yes Historical Provider, MD  ranitidine (ZANTAC) 150 MG tablet Take 150 mg by mouth daily.   Yes Historical Provider, MD  cyclobenzaprine (FLEXERIL)  10 MG tablet Take 1 tablet (10 mg total) by mouth 3 (three) times daily as needed for muscle spasms. Patient not taking: Reported on 02/23/2017 07/10/14   Ivonne AndrewPeter Dammen, PA-C  HYDROcodone-acetaminophen (NORCO/VICODIN) 5-325 MG per tablet Take 1 tablet by mouth every 4 (four) hours as needed for moderate pain. Patient not taking: Reported on 02/23/2017 07/10/14   Ivonne AndrewPeter Dammen, PA-C  meloxicam (MOBIC) 15 MG tablet Take 1 tablet (15 mg total) by mouth daily. Patient not taking: Reported on 02/23/2017 07/10/14   Ivonne AndrewPeter Dammen, PA-C   Allergies  Allergen Reactions  . Other Rash    Per pt medication to treat MRSA - unsure of name     FAMILY HISTORY:  family history is not on file. SOCIAL HISTORY:  reports that she has been smoking.  She has never used smokeless tobacco. She reports that she does not drink alcohol.  REVIEW OF SYSTEMS:   All negative; except for those that are bolded, which indicate positives.  Constitutional: weight loss, weight gain, night sweats, fevers, chills, fatigue, weakness.  HEENT: headaches, sore throat, sneezing, nasal congestion, post nasal drip, difficulty swallowing, tooth/dental problems, visual complaints, visual changes, ear aches. Neuro: difficulty with speech, weakness, numbness, ataxia. CV:  chest pain, orthopnea, PND, swelling in lower extremities, dizziness, palpitations, syncope.  Resp: cough, hemoptysis, dyspnea, wheezing. GI: heartburn, indigestion, abdominal pain, nausea, vomiting, diarrhea, constipation, change in bowel habits, loss of appetite, hematemesis, melena, hematochezia.  GU: dysuria, change in color of urine, urgency or frequency, flank pain, hematuria. MSK: joint pain or swelling, decreased range of motion. Psych: change in mood or affect, depression, anxiety,  suicidal ideations, homicidal ideations. Skin: rash, itching, bruising.  SUBJECTIVE:  No events overnight. Currently getting ECHO. Denies Dyspnea (while laying in bed)  VITAL  SIGNS: Temp:  [97.8 F (36.6 C)-99 F (37.2 C)] 97.8 F (36.6 C) (03/29 0729) Pulse Rate:  [89-105] 91 (03/29 0729) Resp:  [20-28] 28 (03/29 0729) BP: (103-148)/(65-69) 146/65 (03/29 0729) SpO2:  [92 %-95 %] 92 % (03/29 0447) Weight:  [54.8 kg (120 lb 13 oz)] 54.8 kg (120 lb 13 oz) (03/29 0447)  PHYSICAL EXAMINATION: General:  Adult female, no distress  Neuro:  Alert, oriented, follows commands  HEENT:  Normocephalic  Cardiovascular:  RRR, no MRG, NI S1/S2 Lungs: Diminished breath sounds, no wheezes  Abdomen:  Non-tender, non-distended, active bowel sounds  Musculoskeletal:  No acute  Skin:  Warm, dry, intact    Recent Labs Lab 02/23/17 1834 02/24/17 0516 02/25/17 0451  NA 142 145 141  K 2.7* 3.4* 4.6  CL 103 103 107  CO2 30 31 27   BUN <5* 7 9  CREATININE 0.37* 0.34* 0.35*  GLUCOSE 135* 94 93    Recent Labs Lab 02/23/17 0930 02/24/17 0516 02/25/17 0451  HGB 9.1* 8.8* 8.8*  HCT 28.6* 27.5* 27.8*  WBC 7.2 6.5 8.4  PLT 150 147* 143*   Dg Chest 2 View  Result Date: 02/23/2017 CLINICAL DATA:  56 year old female with shortness of breath. Smoker. Initial encounter. EXAM: CHEST  2 VIEW COMPARISON:  None. FINDINGS: Biapical changes suggestion of a bullae and scarring. Underlying mass (particularly right lung apex) cannot be excluded. Fullness right hilum possibly vascular in origin. Mass not excluded. Pulmonary vascular congestion most prominent centrally. Fissure thickening and Kerley B lines may represent result of mild congestive heart. Peripheral wedge-shaped opacity right upper lobe. Subtle infiltrate not excluded. Heart size top-normal. Calcified slightly tortuous aorta. No acute osseous abnormality. IMPRESSION: Pulmonary vascular congestion most prominent centrally. Fissure thickening and Kerley B lines may represent result of mild congestive heart. Peripheral wedge-shaped opacity right upper lobe. Subtle infiltrate not excluded. Apical parenchymal changes, minimal  nodularity right suprahilar region and slightly nodular appearance of right hilum. Given patient's history of smoking and lack of comparisons, chest CT recommended to determine if there is an underlying malignancy contributing to findings. Aortic atherosclerosis. Electronically Signed   By: Lacy Duverney M.D.   On: 02/23/2017 09:05   Ct Angio Chest Pe W/cm &/or Wo Cm  Result Date: 02/23/2017 CLINICAL DATA:  56 year old female with shortness of breath and leg swelling for 1 week. Chest radiographs today suggesting interstitial edema but also with nonspecific wedge-shaped opacity. EXAM: CT ANGIOGRAPHY CHEST WITH CONTRAST TECHNIQUE: Multidetector CT imaging of the chest was performed using the standard protocol during bolus administration of intravenous contrast. Multiplanar CT image reconstructions and MIPs were obtained to evaluate the vascular anatomy. CONTRAST:  100 mL Isovue 370 COMPARISON:  Chest radiographs 0846 hours today. Chest CT 10/31/2008. FINDINGS: Cardiovascular: Adequate contrast bolus timing in the pulmonary arterial tree. Mild respiratory motion artifact. No focal filling defect identified in the pulmonary arteries to suggest acute pulmonary embolism. Mild cardiomegaly. No pericardial effusion. Mild Calcified aortic atherosclerosis. Mediastinum/Nodes: Thyromegaly, new since 2009. No airway narrowing at the thoracic inlet. No mediastinal or hilar lymphadenopathy, although there is suggestion of some generalized mediastinal soft tissue edema. Lungs/Pleura: Major airways are patent except for atelectatic changes at the hila. Chronic upper lobe paraseptal and centrilobular emphysema and confluent apical scarring. Nodular peribronchial opacity in the posterior right upper lobe with confluent ground-glass opacity along the minor  fissure. No other confluent pulmonary opacity. Possible trace pleural effusions. Upper Abdomen: Negative. Musculoskeletal: Mild upper thoracic superior endplate cone cavity  appears chronic. No acute osseous abnormality identified. Review of the MIP images confirms the above findings. IMPRESSION: 1.  No evidence of acute pulmonary embolus. 2. There is cardiomegaly - and possibly mild interstitial edema with trace fusions - but right upper lobe peribronchial nodularity in confluent ground-glass opacity most resembles acute Bronchopneumonia. 3. Underlying upper lobe emphysema. 4. Mild Calcified aortic atherosclerosis. 5. Thyromegaly, new since 2009.  Query thyroid hormone abnormality. Electronically Signed   By: Odessa Fleming M.D.   On: 02/23/2017 11:12   US Thyroid  Result Date: 02/25/2017 CLINICAL DATA:  Incidental on CT.  Thyromegaly on chest CT. EXAM: THYROID ULTRASOUND TECHNIQUE: Ultrasound examination of the thyroid gland and adjacent soft tissues was performed. COMPARISON:  Chest CT 02/23/2017 FINDINGS: Parenchymal Echotexture: Mildly heterogenous Isthmus: Measures 1.0 cm in the AP dimension. Right lobe: 6.8 x 3.4 x 3.4 cm Left lobe: 5.5 x 3.3 x 2.5 cm _________________________________________________________ Estimated total number of nodules >/= 1 cm: 0 Number of spongiform nodules >/=  2 cm not described below (TR1): 0 Number of mixed cystic and solid nodules >/= 1.5 cm not described below (TR2): 0 _________________________________________________________ No discrete nodules are seen within the thyroid gland. Thyroid tissue is slightly hypervascular. IMPRESSION: Enlarged thyroid without discrete nodules. Electronically Signed   By: Richarda Overlie M.D.   On: 02/25/2017 07:48    ASSESSMENT / PLAN:  Acute Hypoxic Respiratory Failure in setting of cornovirus and acute systolic heart failure, +/-AECOPD Plan -Maintain Oxygenation >92 -Continue diuresis as tolerated  -Pulmonary Hygiene  -Pulmicort BID  Bronchopenumonia in setting of cornovirus  Plan -D/C Antibiotics  -Autoimmune work up pending  -Repeat CT outpatient    Pulmonary and Critical Care Medicine [facility] Pager: [phone]  02/25/2017, 8:34 AM

## 2017-02-25 NOTE — Consult Note (Signed)
STAFF NOTE: I, Dr Lavinia Sharps have personally reviewed patient's available data, including medical history, events of note, physical examination and test results as part of my evaluation. I have discussed with resident/NP and other care providers such as pharmacist, RN and RRT.  In addition,  I personally evaluated patient and elicited key findings of   S: 56 year old smoker nos with no past medical hx. Admitted 02/23/2017 with new dyspnea x 1 week aassociated with pedeal edema and also 1-2 day cough with mild hemoptysis. Hx positive for orthpnea and pnd.  PE ruled out on CTA but did have RUL nodularity and some GGO suggestive of focal hemorrhage. Did get 1 dose of Lasix in ER and edema improved. This AM severe cough and PCCM consulted. CT also showed emphysema (CT findings personally confirmed visualized)  RVP positive for coronavirus PCT < 0.1 Urine strep negative BNP 1346    has a past medical history of Herniated disc.   reports that she has been smoking.  She has never used smokeless tobacco.  History reviewed. No pertinent surgical history.  Allergies  Allergen Reactions  . Other Rash    Per pt medication to treat MRSA - unsure of name      There is no immunization history on file for this patient.  History reviewed. No pertinent family history.   Current Facility-Administered Medications:  .  acetaminophen (TYLENOL) tablet 650 mg, 650 mg, Oral, Q6H PRN **OR** acetaminophen (TYLENOL) suppository 650 mg, 650 mg, Rectal, Q6H PRN, Freddrick March, MD .  aspirin EC tablet 81 mg, 81 mg, Oral, Daily, Freddrick March, MD, 81 mg at 02/25/17 0920 .  atorvastatin (LIPITOR) tablet 40 mg, 40 mg, Oral, q1800, Freddrick March, MD, 40 mg at 02/24/17 1723 .  azithromycin (ZITHROMAX) 500 mg in dextrose 5 % 250 mL IVPB, 500 mg, Intravenous, Q24H, Nestor Ramp, MD, 500 mg at 02/24/17 1147 .  cefTRIAXone (ROCEPHIN) 1 g in dextrose 5 % 50 mL IVPB, 1 g, Intravenous, Q24H, Nestor Ramp, MD, 1 g at 02/24/17  1355 .  docusate sodium (COLACE) capsule 100 mg, 100 mg, Oral, BID, Freddrick March, MD, 100 mg at 02/23/17 2209 .  enoxaparin (LOVENOX) injection 40 mg, 40 mg, Subcutaneous, Q24H, Freddrick March, MD .  famotidine (PEPCID) tablet 10 mg, 10 mg, Oral, Daily, Freddrick March, MD, 10 mg at 02/25/17 0915 .  furosemide (LASIX) injection 20 mg, 20 mg, Intravenous, Once, Kalman Shan, MD .  ipratropium-albuterol (DUONEB) 0.5-2.5 (3) MG/3ML nebulizer solution 3 mL, 3 mL, Nebulization, Q4H PRN, Freddrick March, MD .  loratadine (CLARITIN) tablet 10 mg, 10 mg, Oral, Daily, Freddrick March, MD, 10 mg at 02/25/17 0920 .  potassium chloride SA (K-DUR,KLOR-CON) CR tablet 20 mEq, 20 mEq, Oral, Once, Kalman Shan, MD .  promethazine (PHENERGAN) tablet 12.5 mg, 12.5 mg, Oral, Q6H PRN, Freddrick March, MD .  sodium chloride flush (NS) 0.9 % injection 3 mL, 3 mL, Intravenous, Q12H, Freddrick March, MD, 3 mL at 02/25/17 0921   O:  Vitals:   02/24/17 2323 02/25/17 0447 02/25/17 0729 02/25/17 0940  BP: 103/69 (!) 148/65 (!) 146/65   Pulse: 89 91 91   Resp: 20 20 (!) 28   Temp: 98.1 F (36.7 C) 97.9 F (36.6 C) 97.8 F (36.6 C)   TempSrc: Oral Oral Oral   SpO2: 95% 92%  90%  Weight:  54.8 kg (120 lb 13 oz)    Height:       Coughins Mildly tacypneic No wheeze  per se Looks tired from coughing ELEVATED JVP +    LABS   Recent Results (from the past 240 hour(s))  Respiratory Panel by PCR     Status: Abnormal   Collection Time: 02/23/17  4:42 PM  Result Value Ref Range Status   Adenovirus NOT DETECTED NOT DETECTED Final   Coronavirus 229E NOT DETECTED NOT DETECTED Final   Coronavirus HKU1 NOT DETECTED NOT DETECTED Final   Coronavirus NL63 NOT DETECTED NOT DETECTED Final   Coronavirus OC43 DETECTED (A) NOT DETECTED Final   Metapneumovirus NOT DETECTED NOT DETECTED Final   Rhinovirus / Enterovirus NOT DETECTED NOT DETECTED Final   Influenza A NOT DETECTED NOT DETECTED Final   Influenza B NOT DETECTED NOT  DETECTED Final   Parainfluenza Virus 1 NOT DETECTED NOT DETECTED Final   Parainfluenza Virus 2 NOT DETECTED NOT DETECTED Final   Parainfluenza Virus 3 NOT DETECTED NOT DETECTED Final   Parainfluenza Virus 4 NOT DETECTED NOT DETECTED Final   Respiratory Syncytial Virus NOT DETECTED NOT DETECTED Final   Bordetella pertussis NOT DETECTED NOT DETECTED Final   Chlamydophila pneumoniae NOT DETECTED NOT DETECTED Final   Mycoplasma pneumoniae NOT DETECTED NOT DETECTED Final     PULMONARY No results for input(s): PHART, PCO2ART, PO2ART, HCO3, TCO2, O2SAT in the last 168 hours.  Invalid input(s): PCO2, PO2  CBC  Recent Labs Lab 02/23/17 0930 02/24/17 0516 02/25/17 0451  HGB 9.1* 8.8* 8.8*  HCT 28.6* 27.5* 27.8*  WBC 7.2 6.5 8.4  PLT 150 147* 143*    COAGULATION No results for input(s): INR in the last 168 hours.  CARDIAC   Recent Labs Lab 02/23/17 1717 02/23/17 2214 02/24/17 0516  TROPONINI <0.03 <0.03 <0.03   No results for input(s): PROBNP in the last 168 hours.   CHEMISTRY  Recent Labs Lab 02/23/17 0930 02/23/17 1834 02/23/17 2214 02/24/17 0516 02/25/17 0451  NA 142 142  --  145 141  K 2.4* 2.7*  --  3.4* 4.6  CL 104 103  --  103 107  CO2 28 30  --  31 27  GLUCOSE 101* 135*  --  94 93  BUN 7 <5*  --  7 9  CREATININE 0.32* 0.37*  --  0.34* 0.35*  CALCIUM 8.8* 8.7*  --  9.0 9.3  MG  --   --  1.6* 2.0  --    Estimated Creatinine Clearance: 69.4 mL/min (A) (by C-G formula based on SCr of 0.35 mg/dL (L)).   LIVER  Recent Labs Lab 02/23/17 0930  AST 24  ALT 15  ALKPHOS 170*  BILITOT 1.2  PROT 5.6*  ALBUMIN 3.1*     INFECTIOUS  Recent Labs Lab 02/23/17 2214 02/24/17 1243  LATICACIDVEN  --  1.8  PROCALCITON <0.10  --      ENDOCRINE CBG (last 3)   Recent Labs  02/24/17 1615 02/24/17 2212 02/25/17 0730  GLUCAP 113* 203* 87         IMAGING x48h  - image(s) personally visualized  -   highlighted in bold Ct Angio Chest Pe W/cm  &/or Wo Cm  Result Date: 02/23/2017 CLINICAL DATA:  56 year old female with shortness of breath and leg swelling for 1 week. Chest radiographs today suggesting interstitial edema but also with nonspecific wedge-shaped opacity. EXAM: CT ANGIOGRAPHY CHEST WITH CONTRAST TECHNIQUE: Multidetector CT imaging of the chest was performed using the standard protocol during bolus administration of intravenous contrast. Multiplanar CT image reconstructions and MIPs were obtained to evaluate the  vascular anatomy. CONTRAST:  100 mL Isovue 370 COMPARISON:  Chest radiographs 0846 hours today. Chest CT 10/31/2008. FINDINGS: Cardiovascular: Adequate contrast bolus timing in the pulmonary arterial tree. Mild respiratory motion artifact. No focal filling defect identified in the pulmonary arteries to suggest acute pulmonary embolism. Mild cardiomegaly. No pericardial effusion. Mild Calcified aortic atherosclerosis. Mediastinum/Nodes: Thyromegaly, new since 2009. No airway narrowing at the thoracic inlet. No mediastinal or hilar lymphadenopathy, although there is suggestion of some generalized mediastinal soft tissue edema. Lungs/Pleura: Major airways are patent except for atelectatic changes at the hila. Chronic upper lobe paraseptal and centrilobular emphysema and confluent apical scarring. Nodular peribronchial opacity in the posterior right upper lobe with confluent ground-glass opacity along the minor fissure. No other confluent pulmonary opacity. Possible trace pleural effusions. Upper Abdomen: Negative. Musculoskeletal: Mild upper thoracic superior endplate cone cavity appears chronic. No acute osseous abnormality identified. Review of the MIP images confirms the above findings. IMPRESSION: 1.  No evidence of acute pulmonary embolus. 2. There is cardiomegaly - and possibly mild interstitial edema with trace fusions - but right upper lobe peribronchial nodularity in confluent ground-glass opacity most resembles acute  Bronchopneumonia. 3. Underlying upper lobe emphysema. 4. Mild Calcified aortic atherosclerosis. 5. Thyromegaly, new since 2009.  Query thyroid hormone abnormality. Electronically Signed   By: Odessa FlemingH  Hall M.D.   On: 02/23/2017 11:12   [place] Thyroid  Result Date: 02/25/2017 CLINICAL DATA:  Incidental on CT.  Thyromegaly on chest CT. EXAM: THYROID ULTRASOUND TECHNIQUE: Ultrasound examination of the thyroid gland and adjacent soft tissues was performed. COMPARISON:  Chest CT 02/23/2017 FINDINGS: Parenchymal Echotexture: Mildly heterogenous Isthmus: Measures 1.0 cm in the AP dimension. Right lobe: 6.8 x 3.4 x 3.4 cm Left lobe: 5.5 x 3.3 x 2.5 cm _________________________________________________________ Estimated total number of nodules >/= 1 cm: 0 Number of spongiform nodules >/=  2 cm not described below (TR1): 0 Number of mixed cystic and solid nodules >/= 1.5 cm not described below (TR2): 0 _________________________________________________________ No discrete nodules are seen within the thyroid gland. Thyroid tissue is slightly hypervascular. IMPRESSION: Enlarged thyroid without discrete nodules. Electronically Signed   By: Richarda OverlieAdam  Henn M.D.   On: 02/25/2017 07:48      A/P Bronchopneumonia Features c/w viral RUL bronchopneumonia of coronavirus PCT normal Urine strep neg  Plan  - see other sections - dc CAP abx -await autoimmune  - need CT chest in 3 months to resultion - pcp can do this  Coronavirus infection Primary issue  Plan  - see other sctions  - supportive care  Acute systolic congestive heart failure (HCC) High pretest prob forthis   Plan Lasix x 1 Await echo report After that FPT managment  Cough probabluy due to all of above and below  Plan See response to lasix and scheduled nebs If no improveent consider systemic steroids  Emphysema lung (HCC) Seen on cT and is due to smoking  Plan Scheduled duoneb q6h Start pulmicort bid Start mdi before dc Consider systemic  steroids for aECOPD if no improvement with Rx of presumed CHF with lasix  pccm will sign off   .  Rest per NP/medical resident whose note is outlined above and that I agree with  Dr. Kalman ShanMurali Phuong Hillary, M.D., [facility] CenterF.C.C.P Pulmonary and Critical Care Medicine Staff Physician [facility] Pulmonary and Critical Care Pager: [phone], If no answer or between  15:00h - 7:00h: call [phone]  02/25/2017 9:36 AM

## 2017-02-25 NOTE — Assessment & Plan Note (Signed)
Seen on cT and is due to smoking  Plan Scheduled duoneb q6h Start pulmicort bid Start mdi before dc Consider systemic steroids for aECOPD if no improvement with Rx of presumed CHF with lasix

## 2017-02-25 NOTE — Progress Notes (Signed)
Family Medicine Teaching Service Daily Progress Note Intern Pager: [phone]  Patient name: [name] Medical record number: [id number] Date of birth: [birth year] Age: 56 y.o. Gender: female  Primary Care Provider: No PCP Per Patient Consultants: None Code Status: FULL   Pt Overview and Major Events to Date:  Admit 3/27  Assessment and Plan: [name] is a 56 y.o. female presenting with shortness of breath.  With no known significant PMHx but has not seen a doctor in >10 years.    Shortness of breath: Likely 2/2 new developing CHF.   CXR revealed pulmonary vascular congestion with peripheral wedge-shaped opacity in RUL.  Also with apical parenchymal changes and minimal nodularity in right suprahilar region.  In setting of history of smoking, chest CT recommended to determine if underlying malignancy.  CTA was ordered which revealed no evidence of acute PE but did show cardiomegaly, possible mild interstitial edema with trace fusions and RUL peribronchial nodularity in confluent ground-glass opacity most likely bronchopneumonia. Concern for infiltrate vs malignancy due to significant smoking history.  ACS has been ruled out with negative troponins and no ischemia on EKG. Leading differential is new CHF in setting of BNP elevated to 1346, dyspnea on exertion and obvious signs of fluid overload on exam.  Orthopnea and PND is also concerning for CHF. No prior echo obtained.  Patient also likely has component of underlying lung disease given her substantial tobacco use history. Previous imaging dating back to 2009 has shown emphysematous changes. Given Lasix 40 mg x1 on admission and this AM leg swelling improved significantly.  No further diuretics were administered yesterday.   HIV negative.  Vit D level elevated to 110.   -Continuous cardiac monitoring -Continuous pulse ox  -Echocardiogram performed this AM. Read pending.   -Risk stratification labs ordered: TSH <0.010 low.  T3/T4 ordered and  T4 elevated 5.24. T3 also elevated to 557.  A1c 5%, lipid panel wnl and HDL low, 22.  Lipitor 40 and daily aspirin started on 3/28.   -Duonebs Q4 prn   -Tylenol 650 mg Q6 prn -Phenergan Q6 prn   -Monitor fluid status.  I's and O's  -Daily weights -would likely benefit from PFTs outpatient   Cough -Patient notes cough this morning and marked JVD present.  On RA and satting 100%.  -Per Dr. Chase Caller will give IV lasix 20 mg x1  -s/p breathing treatment  -If cough persists can give steroids.  -Will continue to monitor  RUL opacity on CTA.  Patient has remained afebrile and with no white count.  Started on CTX and AZT in ED.  Will treat for CAP in meantime.  Additionally, RVP +for Coronavirus. Consider CXR 4-6 weeks later to check resolution of opacity. Procalcitonin negative.  LA normal.  -Continue antibiotics (Day 3 CTX/Azithro).  Will transition to po abx today >> AZT 250 mg daily and Amox 500 mg TID.  -Will obtain sputum cx given concern for post obstructive pneumonia.   -urinary antigens >> Strep pneumo negative, Legionella pending  -Pulm consulted 3/28 given CT concerning for malignancy and possible post-obstructive pneumonia.  Dr. Chase Caller recommended no bronchoscopy required at this time.  Likely viral vs autoimmune.  Obtain lactic acid and autoimmune labs.   Will follow up with results of studies. CCM to see tomorrow.   -Autoimmune workup:  ANCA,  ANA, dsDNA, GBM, CCP, SSA, SSB pending, CRP 0.8 normal, RF 10.6 normal.  -CCM recs: obtain repeat procalcitonin, odds of bacterial infection low if neg.  Will  continue po antibiotics for course of 7 day total to ensure covered.    Hypokalemia, resolved.  K+ 4.6 this morning. Mag 2.0.  -Daily BMET  -replete as necessary   Anemia: Normocytic. Hgb 9.1 at admission. Prior values in 2009 around 11. Patient endorses some minimal blood streaked sputum but no active bleeding present at admission. Given patient has not been followed medically for  several years, is likely not up to date on cancer screenings. Iron panel and ferritin obtained during this admission is wnl.  -FOBT pending  -discuss cancer screening and malignancy symptoms with patient in AM.  Has not had a colonoscopy or Pap recently.  Recommend outpatient screening.   -Monitor CBC   Elevated Alk Phos: 170 at admission. No prior value available for comparison. LFTs WNL so less likely to be hepatic in nature. May be related to Vit D deficiency as patient reports history of this. Potentially related to thyroid disorder. Parathyroid disorder less likely given corrected Ca for low albumin within normal range.  -Vit D level ordered  -GGT mildly elevated to 78.   Atherosclerosis: Present in aorta on CTA chest at admission. CT abdomen from 2009 revealed moderate abdominal aortic atherosclerosis, somewhat advanced for age. Patient would likely benefit from high intensity statin given these findings and history of tobacco use.  -continue  Lipitor 40 mg and daily aspirin  -counsel on smoking cessation   Thyromegaly: Present on CTA chest. New finding since previous imaging in 2009.  -TSH <0.010 and T4 elevated to 5.24. T3 also elevated to 557.   -Thyroid US >> enlarged thyroid wo discrete nodules.   Prolonged QT .  On EKG obtained in ED noted to be 470.   -Avoid medications that prolong QT   Seasonal allergies:  At home on Claritin.  -Continue Claritin 10 mg daily   FEN/GI: Regular, SLIV Prophylaxis: Lovenox  Disposition: Dispo pending clinical improvement.   Subjective:  Patient has new cough and mild shortness of breath.  No chest pain or leg swelling.    Objective: Temp:  [97.6 F (36.4 C)-98.7 F (37.1 C)] 97.6 F (36.4 C) (03/29 1125) Pulse Rate:  [89-100] 100 (03/29 1125) Resp:  [20-28] 28 (03/29 0729) BP: (103-148)/(65-69) 135/65 (03/29 1125) SpO2:  [90 %-95 %] 93 % (03/29 1134) Weight:  [120 lb 13 oz (54.8 kg)] 120 lb 13 oz (54.8 kg) (03/29 0447)    Physical Exam: General: 56 year old female sitting in hospital bed, in mild distress  Eyes: PERRL, EOMI  ENTM: MMM, o/p clear  Neck: supple, prominent JVD  Cardiovascular: RRR no MRG, palpable pulses Respiratory:diffusely wheezy with some crackles appreciated, on RA with no use of accessory muscles  Gastrointestinal: soft, NT, ND, +bs  Derm: skin is warm and dry Extremities: no edema or tenderness noted  Neuro: AAOx3, no focal deficits, 5/5 strength in upper and LE bilaterally  Psych: normal affect   Laboratory:  Recent Labs Lab 02/23/17 0930 02/24/17 0516 02/25/17 0451  WBC 7.2 6.5 8.4  HGB 9.1* 8.8* 8.8*  HCT 28.6* 27.5* 27.8*  PLT 150 147* 143*    Recent Labs Lab 02/23/17 0930 02/23/17 1834 02/24/17 0516 02/25/17 0451  NA 142 142 145 141  K 2.4* 2.7* 3.4* 4.6  CL 104 103 103 107  CO2 '28 30 31 27  ' BUN 7 <5* 7 9  CREATININE 0.32* 0.37* 0.34* 0.35*  CALCIUM 8.8* 8.7* 9.0 9.3  PROT 5.6*  --   --   --   BILITOT  1.2  --   --   --   ALKPHOS 170*  --   --   --   ALT 15  --   --   --   AST 24  --   --   --   GLUCOSE 101* 135* 94 93   Imaging/Diagnostic Tests: No results found. Lovenia Kim, MD 02/25/2017, 12:48 PM PGY-1, [facility] Intern pager: [phone], text pages welcome

## 2017-02-25 NOTE — Progress Notes (Signed)
Message sent to pharmacy to request zithromax

## 2017-02-26 LAB — GLUCOSE, CAPILLARY
Glucose-Capillary: 85 mg/dL (ref 65–99)
Glucose-Capillary: 133 mg/dL — ABNORMAL HIGH (ref 65–99)

## 2017-02-26 LAB — LEGIONELLA PNEUMOPHILA SEROGP 1 UR AG: L. pneumophila Serogp 1 Ur Ag: NEGATIVE

## 2017-02-26 LAB — BASIC METABOLIC PANEL
Anion gap: 10 (ref 5–15)
BUN: 5 mg/dL — ABNORMAL LOW (ref 6–20)
CO2: 26 mmol/L (ref 22–32)
Calcium: 9.2 mg/dL (ref 8.9–10.3)
Chloride: 106 mmol/L (ref 101–111)
Creatinine, Ser: <0.3 mg/dL — ABNORMAL LOW (ref 0.44–1.00)
Glucose, Bld: 85 mg/dL (ref 65–99)
Potassium: 4.4 mmol/L (ref 3.5–5.1)
Sodium: 142 mmol/L (ref 135–145)

## 2017-02-26 LAB — PHOSPHORUS: Phosphorus: 5.5 mg/dL — ABNORMAL HIGH (ref 2.5–4.6)

## 2017-02-26 LAB — EXPECTORATED SPUTUM ASSESSMENT W GRAM STAIN, RFLX TO RESP C

## 2017-02-26 LAB — CBC
HCT: 27.9 % — ABNORMAL LOW (ref 36.0–46.0)
Hemoglobin: 8.7 g/dL — ABNORMAL LOW (ref 12.0–15.0)
MCH: 28.7 pg (ref 26.0–34.0)
MCHC: 31.2 g/dL (ref 30.0–36.0)
MCV: 92.1 fL (ref 78.0–100.0)
Platelets: 146 10*3/uL — ABNORMAL LOW (ref 150–400)
RBC: 3.03 MIL/uL — ABNORMAL LOW (ref 3.87–5.11)
RDW: 15.3 % (ref 11.5–15.5)
WBC: 7.6 10*3/uL (ref 4.0–10.5)

## 2017-02-26 LAB — EXPECTORATED SPUTUM ASSESSMENT W REFEX TO RESP CULTURE

## 2017-02-26 LAB — MAGNESIUM: Magnesium: 1.7 mg/dL (ref 1.7–2.4)

## 2017-02-26 LAB — PROCALCITONIN: Procalcitonin: <0.1 ng/mL

## 2017-02-26 MED ORDER — ALBUTEROL SULFATE HFA 108 (90 BASE) MCG/ACT IN AERS: 2.0000 | INHALATION_SPRAY | Freq: Four times a day (QID) | RESPIRATORY_TRACT | 2 refills | Status: DC | PRN

## 2017-02-26 MED ORDER — ASPIRIN 81 MG PO TBEC: 81.0000 mg | DELAYED_RELEASE_TABLET | Freq: Every day | ORAL | 2 refills | Status: DC

## 2017-02-26 MED ORDER — ATORVASTATIN CALCIUM 40 MG PO TABS: 40.0000 mg | ORAL_TABLET | Freq: Every day | ORAL | 2 refills | Status: DC

## 2017-02-26 MED ORDER — FUROSEMIDE 20 MG PO TABS: 20.0000 mg | ORAL_TABLET | Freq: Every day | ORAL | 2 refills | Status: DC

## 2017-02-26 MED ORDER — AZITHROMYCIN 250 MG PO TABS: ORAL_TABLET | ORAL | 0 refills | Status: DC

## 2017-02-26 MED ORDER — AMOXICILLIN 500 MG PO CAPS: 500.0000 mg | ORAL_CAPSULE | Freq: Three times a day (TID) | ORAL | 0 refills | Status: AC

## 2017-02-26 MED ORDER — BUDESONIDE 0.25 MG/2ML IN SUSP: 0.2500 mg | Freq: Two times a day (BID) | RESPIRATORY_TRACT | 12 refills | Status: DC

## 2017-02-26 NOTE — Progress Notes (Signed)
Pt d/c off floor to awiiting transport via w/c at 1517.  Amanda Pea, Charity fundraiser.

## 2017-02-26 NOTE — Discharge Summary (Signed)
Family Medicine Teaching [facility] Discharge Summary  Patient name: [name] Medical record number: [id number] Date of birth: Mar 30, 1961 Age: 56 y.o. Gender: female Date of Admission: 02/23/2017  Date of Discharge: 02/26/2017  Admitting Physician: Nestor Ramp, MD  Primary Care Provider: No PCP Per Patient Consultants: CCM   Indication for Hospitalization: Shortness of breath   Discharge Diagnoses/Problem List:  Shortness of breath Emphysema Cough Coronavirus HLD  Hypokalemia Anemia Atherosclerosis Thyromegaly  Disposition: Home   Discharge Condition: Stable, improved   Discharge Exam:  General: 56 year old female sitting on hospital bed, in NAD  Eyes: PERRL, EOMI  ENTM: MMM, o/p clear  Neck: supple, no JVD  Cardiovascular: RRR no MRG, palpable pulses Respiratory: CTA with no crackles appreciated, comfortable work of breathing Gastrointestinal: soft, NT, ND, +bs  Derm: skin is warm and dry Extremities: no edema or tenderness noted   Neuro: AAOx3, no focal deficits, 5/5 strength in upper and LE bilaterally  Psych: normal affect   Brief Hospital Course:  [name] is a 56 yo female who presented to the ED with shortness of breath x 1 week.  No known significant PMHx but also does not have a PCP and has not seen a doctor in >10 years.  On arrival, VSS, afebrile with no white count and satting 100% on RA.  Labs notable for hypokalemia to 2.4.  Potassium was repleted and given 40 mg IV lasix in ED.   CXR revealed pulmonary vascular congestion with peripheral wedge-shaped opacity in RUL.  Also with apical parenchymal changes and minimal nodularity in right suprahilar region.  In setting of history of smoking, chest CT recommended to determine if underlying malignancy.  CTA was ordered which revealed no evidence of acute PE but did show cardiomegaly, possible mild interstitial edema with trace fusions and RUL peribronchial nodularity in confluent ground-glass opacity most  likely bronchopneumonia. She was started on CTX and AZT in the ED to treat for possible CAP.  However concern for infiltrate vs malignancy due to significant smoking history. Discussed case with Dr. Margo Aye who noted that this is unlikely malignancy but recommended follow up CT chest and CXR in 6 weeks to check if resolved.  RVP +for coronavirus. ACS ruled out with EKG without ST changes and troponins remained negative.  Given she was afebrile with no white count and CT findings possibly suspicious for a mass, concern for post-obstructive pneumonia and CCM was consulted in the likelihood they may want to evaluate with bronchoscopy.  They recommended no bronch for now, and consider autoimmune workup as this may be a combination of viral vs autoimmune etiology.  Autoimmune labs returned normal.    Suspected likely new CHF with elevated BNP to 1346, new onset dyspnea on exertion and obvious signs of fluid overload on exam. No prior echo.  Lasix resolved her leg swelling and she was clinically well appearing the following day.  However she developed a new cough shortly after with JVD and was working harder to breath with some crackles noted on exam.  CCM saw and was given 20 mg IV lasix and scheduled duonebs.   Her cough and shortness of breath resolved overnight and lungs sounded clear. Echo returned with 50-55% EF, G2DD.  Of note, PAP 60.    She was discharged home with 20 mg po Lasix daily,  pulmicort BID and a MDI to be used prn. At time of discharge she was stable and back to her baseline.  Of note, risk stratification  labs ordered on admission with lipid panel wnl and HDL low at 22.  On CTA atherosclerosis present dating back to a CT abdomen from 2009 with moderate abdominal aortic atherosclerosis, somewhat advanced for age.  She was started on Lipitor 40 mg and a daily aspirin for this during this hospitalization.  Additionally, TSH was <0.010 and thyromegaly present on CTA.  T3 and T4 ordered and both elevated.  Thyroid US revealed enlarged thyroid without discrete nodules.  To be worked up outpatient.  Patient without PCP and was scheduled for follow up appointment with [facility].     Issues for Follow Up:  1. Respiratory status.  Patient with shortness of breath and cough during hospitalization.  New CHF with EF 50-55%, G2DD.  2. Discharged home with Lasix 20 mg daily.  Follow up fluid status, electrolytes with BMET and make sure she is taking this.  3. Hypokalemia on admission to 2.4.  Repeat BMET.  4. Peripheral wedge-shaped opacity in RUL on CXR.  CT with cardiomegaly, possible mild interstitial edema with trace fusions and RUL peribronchial nodularity in confluent ground-glass opacity most likely bronchopneumonia. Concern for infiltrate vs malignancy due to significant smoking history.   Will need CT chest in 3 months to follow up, and CXR in 6 weeks to re-assess.   5. Discharged on Pulmicort BID and Rx for albuterol inhaler prn.   6. Counsel/encourage smoking cessation.  7. Patient to complete a total of 7 days antibiotics for suspected CAP: AZT 250 mg daily and Amox 500 mg TID.   8. Started on daily aspirin and Lipitor 40 mg during this hospitalization due to evidence of atherosclerosis on CT chest, somewhat advanced for age.  Ensure she is taking this.   9. Will need to be worked up for thyroid.  Thyromegaly present on CTA chest, TSH <0.010, T3 557, T4 5.24.  Thyroid US revealed enlarged thyroid wo discrete nodules.   Significant Procedures:  None   Significant Labs and Imaging:   Recent Labs Lab 02/24/17 0516 02/25/17 0451 02/26/17 0446  WBC 6.5 8.4 7.6  HGB 8.8* 8.8* 8.7*  HCT 27.5* 27.8* 27.9*  PLT 147* 143* 146*    Recent Labs Lab 02/23/17 0930 02/23/17 1834 02/23/17 2214 02/24/17 0516 02/25/17 0451 02/26/17 0446  NA 142 142  --  145 141 142  K 2.4* 2.7*  --  3.4* 4.6 4.4  CL 104 103  --  103 107 106  CO2 28 30  --  GLUCOSE 101* 135*  --  94 93 85  BUN 7 <5*  --  7  9 5*  CREATININE 0.32* 0.37*  --  0.34* 0.35* <0.30*  CALCIUM 8.8* 8.7*  --  9.0 9.3 9.2  MG  --   --  1.6* 2.0  --  1.7  PHOS  --   --   --   --   --  5.5*  ALKPHOS 170*  --   --   --   --   --   AST 24  --   --   --   --   --   ALT 15  --   --   --   --   --   ALBUMIN 3.1*  --   --   --   --   --    Results/Tests Pending at Time of Discharge: Legionella   Discharge Medications:  Allergies as of 02/26/2017      Reactions   Other  Rash   Per pt medication to treat MRSA - unsure of name       Medication List    STOP taking these medications   cholecalciferol 1000 units tablet Commonly known as:  VITAMIN D   cyclobenzaprine 10 MG tablet Commonly known as:  FLEXERIL   HYDROcodone-acetaminophen 5-325 MG tablet Commonly known as:  NORCO/VICODIN   meloxicam 15 MG tablet Commonly known as:  MOBIC     TAKE these medications   albuterol 108 (90 Base) MCG/ACT inhaler Commonly known as:  PROVENTIL HFA;VENTOLIN HFA Inhale 2 puffs into the lungs every 6 (six) hours as needed for wheezing or shortness of breath.   amoxicillin 500 MG capsule Commonly known as:  AMOXIL Take 1 capsule (500 mg total) by mouth every 8 (eight) hours.   aspirin 81 MG EC tablet Take 1 tablet (81 mg total) by mouth daily. Start taking on:  02/27/2017   atorvastatin 40 MG tablet Commonly known as:  LIPITOR Take 1 tablet (40 mg total) by mouth daily at 6 PM.   azithromycin 250 MG tablet Commonly known as:  ZITHROMAX Take 1 tablet daily Start taking on:  02/27/2017   budesonide 0.25 MG/2ML nebulizer solution Commonly known as:  PULMICORT Take 2 mLs (0.25 mg total) by nebulization 2 (two) times daily.   cetirizine 10 MG tablet Commonly known as:  ZYRTEC Take 10 mg by mouth daily.   EXCEDRIN TENSION HEADACHE 500-65 MG Tabs Generic drug:  Acetaminophen-Caffeine Take 1 tablet by mouth daily as needed (pain).   furosemide 20 MG tablet Commonly known as:  LASIX Take 1 tablet (20 mg total) by mouth  daily.   ranitidine 150 MG tablet Commonly known as:  ZANTAC Take 150 mg by mouth daily.      Discharge Instructions: Please refer to Patient Instructions section of EMR for full details.  Patient was counseled important signs and symptoms that should prompt return to medical care, changes in medications, dietary instructions, activity restrictions, and follow up appointments.   Follow-Up Appointments: Follow-up Information    Freddrick March, MD. Go on 03/03/2017.   Why:  Hospital follow-up appointment at 1:30 PM.  Please arrive by 1:15 PM.  Contact information: [address] [place] [address] [phone]          Freddrick March, MD 02/26/2017, 1:34 PM PGY-1, [facility]

## 2017-02-26 NOTE — ED Provider Notes (Signed)
[facility]-EMERGENCY DEPT Provider Note   CSN: [id number] Arrival date & time: 02/23/17  0830     History   Chief Complaint Chief Complaint  Patient presents with  . Shortness of Breath    HPI [name] is a 56 y.o. female.  HPI Patient presents to the emergency department with shortness of breath and lower extremity swelling.  The patient states that the swelling is been ongoing for quite a while, but the shortness breath is increased over the last few days.  She states she has also had shortness of breath for quite a while.  Patient states that exertion seems to make her symptoms worse.  Patient did not take any medications prior to arrival for her symptoms. The patient denies chest pain,  headache,blurred vision, neck pain, fever, weakness, numbness, dizziness, anorexia, edema, abdominal pain, nausea, vomiting, diarrhea, rash, back pain, dysuria, hematemesis, bloody stool, near syncope, or syncope.  She has also had some cough over the last 2 days Past Medical History:  Diagnosis Date  . Herniated disc     Patient Active Problem List   Diagnosis Date Noted  . Coronavirus infection 02/25/2017  . Cough 02/25/2017  . Emphysema lung (HCC) 02/25/2017  . Shortness of breath 02/23/2017  . Elevated brain natriuretic peptide (BNP) level 02/23/2017  . Bronchopneumonia 02/23/2017  . Lower extremity edema 02/23/2017  . Anemia 02/23/2017  . Elevated alkaline phosphatase level 02/23/2017  . Acute systolic congestive heart failure (HCC)   . Hypokalemia     History reviewed. No pertinent surgical history.  OB History    No data available       Home Medications    Prior to Admission medications   Medication Sig Start Date End Date Taking? Authorizing Provider  Acetaminophen-Caffeine (EXCEDRIN TENSION HEADACHE) 500-65 MG TABS Take 1 tablet by mouth daily as needed (pain).   Yes Historical Provider, MD  cetirizine (ZYRTEC) 10 MG tablet Take 10 mg by mouth daily.   Yes Historical  Provider, MD  ranitidine (ZANTAC) 150 MG tablet Take 150 mg by mouth daily.   Yes Historical Provider, MD  albuterol (PROVENTIL HFA;VENTOLIN HFA) 108 (90 Base) MCG/ACT inhaler Inhale 2 puffs into the lungs every 6 (six) hours as needed for wheezing or shortness of breath. 02/26/17   Freddrick March, MD  amoxicillin (AMOXIL) 500 MG capsule Take 1 capsule (500 mg total) by mouth every 8 (eight) hours. 02/26/17 03/01/17  Freddrick March, MD  aspirin EC 81 MG EC tablet Take 1 tablet (81 mg total) by mouth daily. 02/27/17   Freddrick March, MD  atorvastatin (LIPITOR) 40 MG tablet Take 1 tablet (40 mg total) by mouth daily at 6 PM. 02/26/17   Freddrick March, MD  azithromycin (ZITHROMAX) 250 MG tablet Take 1 tablet daily 02/27/17   Freddrick March, MD  budesonide (PULMICORT) 0.25 MG/2ML nebulizer solution Take 2 mLs (0.25 mg total) by nebulization 2 (two) times daily. 02/26/17   Freddrick March, MD  furosemide (LASIX) 20 MG tablet Take 1 tablet (20 mg total) by mouth daily. 02/26/17   Freddrick March, MD    Family History History reviewed. No pertinent family history.  Social History Social History  Substance Use Topics  . Smoking status: Current Every Day Smoker  . Smokeless tobacco: Never Used  . Alcohol use No     Allergies   Other   Review of Systems Review of Systems  All other systems negative except as documented in the HPI. All pertinent positives and negatives as reviewed  in the HPI. Physical Exam Updated Vital Signs BP (!) 123/58 (BP Location: Left Arm)   Pulse 97   Temp 98.4 F (36.9 C) (Oral)   Resp 18   Ht  (1.626 m)   Wt 53.5 kg   SpO2 98%   BMI 20.24 kg/m   Physical Exam  Constitutional: She is oriented to person, place, and time. She appears well-developed and well-nourished. No distress.  HENT:  Head: Normocephalic and atraumatic.  Mouth/Throat: Oropharynx is clear and moist.  Eyes: Pupils are equal, round, and reactive to light.  Neck: Normal range of motion. Neck supple.    Cardiovascular: Normal rate, regular rhythm and normal heart sounds.  Exam reveals no gallop and no friction rub.   No murmur heard. Pulmonary/Chest: Effort normal. No respiratory distress. She has wheezes. She has rales.  Abdominal: Soft. Bowel sounds are normal. She exhibits no distension. There is no tenderness.  Musculoskeletal: She exhibits edema.  Neurological: She is alert and oriented to person, place, and time. She exhibits normal muscle tone. Coordination normal.  Skin: Skin is warm and dry. Capillary refill takes less than 2 seconds. No rash noted. No erythema.  Psychiatric: She has a normal mood and affect. Her behavior is normal.  Nursing note and vitals reviewed.    ED Treatments / Results  Labs (all labs ordered are listed, but only abnormal results are displayed) Labs Reviewed  RESPIRATORY PANEL BY PCR - Abnormal; Notable for the following:       Result Value   Coronavirus OC43 DETECTED (*)    All other components within normal limits  COMPREHENSIVE METABOLIC PANEL - Abnormal; Notable for the following:    Potassium 2.4 (*)    Glucose, Bld 101 (*)    Creatinine, Ser 0.32 (*)    Calcium 8.8 (*)    Total Protein 5.6 (*)    Albumin 3.1 (*)    Alkaline Phosphatase 170 (*)    All other components within normal limits  CBC WITH DIFFERENTIAL/PLATELET - Abnormal; Notable for the following:    RBC 3.13 (*)    Hemoglobin 9.1 (*)    HCT 28.6 (*)    All other components within normal limits  BRAIN NATRIURETIC PEPTIDE - Abnormal; Notable for the following:    B Natriuretic Peptide 1,346.4 (*)    All other components within normal limits  BASIC METABOLIC PANEL - Abnormal; Notable for the following:    Potassium 2.7 (*)    Glucose, Bld 135 (*)    BUN <5 (*)    Creatinine, Ser 0.37 (*)    Calcium 8.7 (*)    All other components within normal limits  TSH - Abnormal; Notable for the following:    TSH <0.010 (*)    All other components within normal limits  LIPID PANEL -  Abnormal; Notable for the following:    HDL 22 (*)    All other components within normal limits  BASIC METABOLIC PANEL - Abnormal; Notable for the following:    Potassium 3.4 (*)    Creatinine, Ser 0.34 (*)    All other components within normal limits  CBC - Abnormal; Notable for the following:    RBC 3.02 (*)    Hemoglobin 8.8 (*)    HCT 27.5 (*)    RDW 15.8 (*)    Platelets 147 (*)    All other components within normal limits  MAGNESIUM - Abnormal; Notable for the following:    Magnesium 1.6 (*)  All other components within normal limits  T3 - Abnormal; Notable for the following:    T3, Total 557 (*)    All other components within normal limits  T4, FREE - Abnormal; Notable for the following:    Free T4 5.24 (*)    All other components within normal limits  GAMMA GT - Abnormal; Notable for the following:    GGT 78 (*)    All other components within normal limits  VITAMIN D 25 HYDROXY (VIT D DEFICIENCY, FRACTURES) - Abnormal; Notable for the following:    Vit D, 25-Hydroxy 110.0 (*)    All other components within normal limits  GLUCOSE, CAPILLARY - Abnormal; Notable for the following:    Glucose-Capillary 112 (*)    All other components within normal limits  GLUCOSE, CAPILLARY - Abnormal; Notable for the following:    Glucose-Capillary 163 (*)    All other components within normal limits  GLUCOSE, CAPILLARY - Abnormal; Notable for the following:    Glucose-Capillary 113 (*)    All other components within normal limits  BASIC METABOLIC PANEL - Abnormal; Notable for the following:    Creatinine, Ser 0.35 (*)    All other components within normal limits  CBC - Abnormal; Notable for the following:    RBC 3.00 (*)    Hemoglobin 8.8 (*)    HCT 27.8 (*)    RDW 15.8 (*)    Platelets 143 (*)    All other components within normal limits  GLUCOSE, CAPILLARY - Abnormal; Notable for the following:    Glucose-Capillary 203 (*)    All other components within normal limits    GLUCOSE, CAPILLARY - Abnormal; Notable for the following:    Glucose-Capillary 109 (*)    All other components within normal limits  GLUCOSE, CAPILLARY - Abnormal; Notable for the following:    Glucose-Capillary 108 (*)    All other components within normal limits  PHOSPHORUS - Abnormal; Notable for the following:    Phosphorus 5.5 (*)    All other components within normal limits  CBC - Abnormal; Notable for the following:    RBC 3.03 (*)    Hemoglobin 8.7 (*)    HCT 27.9 (*)    Platelets 146 (*)    All other components within normal limits  BASIC METABOLIC PANEL - Abnormal; Notable for the following:    BUN 5 (*)    Creatinine, Ser <0.30 (*)    All other components within normal limits  GLUCOSE, CAPILLARY - Abnormal; Notable for the following:    Glucose-Capillary 133 (*)    All other components within normal limits  CULTURE, EXPECTORATED SPUTUM-ASSESSMENT  CULTURE, RESPIRATORY (NON-EXPECTORATED)  URINALYSIS, ROUTINE W REFLEX MICROSCOPIC  HIV ANTIBODY (ROUTINE TESTING)  TROPONIN I  TROPONIN I  TROPONIN I  HEMOGLOBIN A1C  STREP PNEUMONIAE URINARY ANTIGEN  LEGIONELLA PNEUMOPHILA SEROGP 1 UR AG  IRON AND TIBC  FERRITIN  PROCALCITONIN  MAGNESIUM  ANCA TITERS  C-REACTIVE PROTEIN  ANTINUCLEAR ANTIBODIES, IFA  ANTI-DNA ANTIBODY, DOUBLE-STRANDED  GLOMERULAR BASEMENT MEMBRANE ANTIBODIES  RHEUMATOID FACTOR  CYCLIC CITRUL PEPTIDE ANTIBODY, IGG/IGA  SJOGRENS SYNDROME-A EXTRACTABLE NUCLEAR ANTIBODY  SJOGRENS SYNDROME-B EXTRACTABLE NUCLEAR ANTIBODY  LACTIC ACID, PLASMA  GLUCOSE, CAPILLARY  PROCALCITONIN  PROCALCITONIN  MAGNESIUM  GLUCOSE, CAPILLARY  GLUCOSE, CAPILLARY    EKG  EKG Interpretation  Date/Time:  Tuesday February 23 2017 08:36:04 EDT Ventricular Rate:  93 PR Interval:  144 QRS Duration: 80 QT Interval:  386 QTC Calculation: 479 R Axis:  97 Text Interpretation:  Normal sinus rhythm Possible Left atrial enlargement Rightward axis Cannot rule out Anterior  infarct , age undetermined Abnormal ECG Confirmed by Ranae Palms  MD, DAVID ([id number]) on 02/25/2017 2:50:53 PM       Radiology No results found.  Procedures Procedures (including critical care time)  Medications Ordered in ED Medications  loratadine (CLARITIN) tablet 10 mg (10 mg Oral Given 02/26/17 1029)  famotidine (PEPCID) tablet 10 mg (10 mg Oral Given 02/26/17 1028)  enoxaparin (LOVENOX) injection 40 mg (40 mg Subcutaneous Given 02/25/17 1745)  sodium chloride flush (NS) 0.9 % injection 3 mL (3 mLs Intravenous Given 02/26/17 1029)  acetaminophen (TYLENOL) tablet 650 mg (not administered)    Or  acetaminophen (TYLENOL) suppository 650 mg (not administered)  docusate sodium (COLACE) capsule 100 mg (100 mg Oral Not Given 02/26/17 1027)  promethazine (PHENERGAN) tablet 12.5 mg (not administered)  atorvastatin (LIPITOR) tablet 40 mg (40 mg Oral Given 02/25/17 1753)  aspirin EC tablet 81 mg (81 mg Oral Given 02/26/17 1027)  budesonide (PULMICORT) nebulizer solution 0.25 mg (0.25 mg Nebulization Given 02/26/17 0957)  albuterol (PROVENTIL) (2.5 MG/3ML) 0.083% nebulizer solution 2.5 mg (not administered)  azithromycin (ZITHROMAX) tablet 250 mg (250 mg Oral Given 02/26/17 1029)  amoxicillin (AMOXIL) capsule 500 mg (500 mg Oral Given 02/26/17 1421)  hydrOXYzine (ATARAX/VISTARIL) tablet 25 mg (not administered)  iopamidol (ISOVUE-370) 76 % injection (100 mLs  Contrast Given 02/23/17 1046)  potassium chloride 30 mEq in sodium chloride 0.9 % 265 mL (KCL MULTIRUN) IVPB (30 mEq Intravenous Given 02/23/17 1222)  potassium chloride SA (K-DUR,KLOR-CON) CR tablet 40 mEq (40 mEq Oral Given 02/23/17 1159)  cefTRIAXone (ROCEPHIN) 1 g in dextrose 5 % 50 mL IVPB (0 g Intravenous Stopped 02/23/17 1514)  azithromycin (ZITHROMAX) 500 mg in dextrose 5 % 250 mL IVPB (0 mg Intravenous Stopped 02/23/17 1330)  furosemide (LASIX) injection 40 mg (40 mg Intravenous Given 02/23/17 1509)  potassium chloride SA (K-DUR,KLOR-CON) CR  tablet 40 mEq (40 mEq Oral Given 02/24/17 0609)  magnesium sulfate IVPB 2 g 50 mL (2 g Intravenous Given 02/24/17 0032)  potassium chloride SA (K-DUR,KLOR-CON) CR tablet 40 mEq (40 mEq Oral Given 02/24/17 2128)  furosemide (LASIX) injection 20 mg (20 mg Intravenous Given 02/25/17 1024)  potassium chloride SA (K-DUR,KLOR-CON) CR tablet 20 mEq (20 mEq Oral Given 02/25/17 1024)     Initial Impression / Assessment and Plan / ED Course  I have reviewed the triage vital signs and the nursing notes.  Pertinent labs & imaging results that were available during my care of the patient were reviewed by me and considered in my medical decision making (see chart for details).     Patient will be in the hospital for congestive heart failure along with pneumonia.  Patient is advised of her x-ray results.  She is also given antibiotics for community-acquired pneumonia.  Patient agrees the plan and all questions were answered  Final Clinical Impressions(s) / ED Diagnoses   Final diagnoses:  Acute systolic congestive heart failure (HCC)  Community acquired pneumonia of right upper lobe of lung (HCC)  SOB (shortness of breath)  Hypokalemia  Thyromegaly    New Prescriptions Discharge Medication List as of 02/26/2017  2:41 PM    START taking these medications   Details  albuterol (PROVENTIL HFA;VENTOLIN HFA) 108 (90 Base) MCG/ACT inhaler Inhale 2 puffs into the lungs every 6 (six) hours as needed for wheezing or shortness of breath., Starting Fri 02/26/2017, Normal    amoxicillin (AMOXIL)  500 MG capsule Take 1 capsule (500 mg total) by mouth every 8 (eight) hours., Starting Fri 02/26/2017, Until Mon 03/01/2017, Normal    aspirin EC 81 MG EC tablet Take 1 tablet (81 mg total) by mouth daily., Starting Sat 02/27/2017, Normal    atorvastatin (LIPITOR) 40 MG tablet Take 1 tablet (40 mg total) by mouth daily at 6 PM., Starting Fri 02/26/2017, Normal    azithromycin (ZITHROMAX) 250 MG tablet Take 1 tablet daily,  Normal    budesonide (PULMICORT) 0.25 MG/2ML nebulizer solution Take 2 mLs (0.25 mg total) by nebulization 2 (two) times daily., Starting Fri 02/26/2017, Normal    furosemide (LASIX) 20 MG tablet Take 1 tablet (20 mg total) by mouth daily., Starting Fri 02/26/2017, Normal         Charlestine Night, PA-C 02/26/17 1656    Charlestine Night, PA-C 02/26/17 1656    Raeford Razor, MD 02/27/17 [phone]

## 2017-02-26 NOTE — Care Management Note (Signed)
Case Management Note  Patient Details  Name: [name] MRN: [id number] Date of Birth: August 03, 1961  Subjective/Objective:                 Patient from home, insured, follow up appointment made at DC, on AVS. No CM consult, needs, or orders identified at this time. Patient with current order to DC to home.    Action/Plan:   Expected Discharge Date:  02/26/17               Expected Discharge Plan:  Home/Self Care  In-House Referral:     Discharge planning Services  CM Consult  Post Acute Care Choice:    Choice offered to:     DME Arranged:    DME Agency:     HH Arranged:    HH Agency:     Status of Service:  Completed, signed off  If discussed at [employer] of Stay Meetings, dates discussed:    Additional Comments:  Lawerance Sabal, RN 02/26/2017, 12:05 PM

## 2017-02-26 NOTE — Progress Notes (Signed)
Family Medicine Teaching Service Daily Progress Note Intern Pager: [phone]  Patient name: [name] Medical record number: [id number] Date of birth: August 07, 1961 Age: 56 y.o. Gender: female  Primary Care Provider: No PCP Per Patient Consultants: None Code Status: FULL   Pt Overview and Major Events to Date:  Admit 3/27  Assessment and Plan: [name] is a 56 y.o. female presenting with shortness of breath.  With no known significant PMHx but has not seen a doctor in >10 years.    Shortness of breath: Suspected 2/2 new developing CHF.   CXR revealed pulmonary vascular congestion with peripheral wedge-shaped opacity in RUL.  Also with apical parenchymal changes and minimal nodularity in right suprahilar region.  In setting of history of smoking, chest CT recommended to determine if underlying malignancy.  CTA was ordered which revealed no evidence of acute PE but did show cardiomegaly, possible mild interstitial edema with trace fusions and RUL peribronchial nodularity in confluent ground-glass opacity most likely bronchopneumonia.  ACS has been ruled out with negative troponins and no ischemia on EKG. Leading differential is new CHF in setting of BNP elevated to 1346, dyspnea on exertion and obvious signs of fluid overload on exam.   Orthopnea and PND is also concerning for CHF.  Given 40 mg Lasix IV in ED and additional 20 mg IV on 3/29 for worsening shortness of breath.  On RA.  Leg swelling has improved.  Has not needed any additional diuretics.   -Echo with 50-55%, G2DD and PAP 60.   -Can discharge with Lasix for presumed CHF but consider systemic steroids for COPD if no improvement outpatient  -Continuous cardiac monitoring   -Continuous pulse ox  -Scheduled duonebs Q6 prn   -Monitor fluid status.  I's and O's  -Daily weights >> down 18 lbs since hospitalization   Emphysema.  Seen on CT and is due to smoking.  Patient with substantial tobacco use history. Previous imaging dating  back to 2009 has shown emphysematous changes.  Will need CT chest in 3 months to follow up + repeat CXR in 6 weeks.  -Per CCM team, pulmicort BID and start MDI before discharge.  -would likely benefit from PFTs outpatient   Cough, resolved . Patient reports cough has cleared this morning.  Feels much better and lungs clear on exam.  On RA and satting 100%.  - scheduled duonebs Q6 h -Will continue to monitor   RUL opacity on CTA/?CAP/+coronavirus.  Patient has remained afebrile and with no white count.  Started on CTX and AZT in ED.  Will treat for CAP in meantime.  Consider CXR 4-6 weeks later to check resolution of opacity. Procalcitonin negative.  LA normal.   -Transitioned to po abx 3/29 >> AZT 250 mg daily and Amox 500 mg TID. (Day 4/7 of abx) -Sputum cx obtained given concern for post obstructive pneumonia >> abundant wbc, G+ cocci in pairs, rare G- rods.  -urinary antigens >> Strep pneumo negative, Legionella pending.   -CCM recs >> autoimmune workup, CT in 3 months, pulmicort BID and start MDI before discharge.  -Autoimmune workup negative:  CRP, RF, ANCA, ANA, dsDNA, GBM, CCP, SSA and SSB all within normal limits.   HLD.  Lipid panel wnl and HDL low, 22.   -Lipitor 40 and daily aspirin started  Hypokalemia, resolved.  K+ 4.6 this morning. Mag 2.0.  -Daily BMET  -replete as necessary   Anemia: Normocytic. Hgb 9.1 at admission. Prior values in 2009 around 11. Patient endorses  some minimal blood streaked sputum but no active bleeding present at admission. Given patient has not been followed medically for several years, is likely not up to date on cancer screenings. Iron panel and ferritin obtained during this admission is wnl.  -FOBT pending  -discuss cancer screening and malignancy symptoms with patient in AM.  Has not had a colonoscopy or Pap recently.  Recommend outpatient screening.   -Monitor CBC   Elevated Alk Phos: 170 at admission. No prior value available for comparison.  LFTs WNL so less likely to be hepatic in nature. May be related to Vit D deficiency as patient reports history of this. Potentially related to thyroid disorder. Parathyroid disorder less likely given corrected Ca for low albumin within normal range.  -Vit D elevated  -GGT mildly elevated to 78.   Atherosclerosis: Present in aorta on CTA chest at admission. CT abdomen from 2009 revealed moderate abdominal aortic atherosclerosis, somewhat advanced for age. Patient would likely benefit from high intensity statin given these findings and history of tobacco use.  -continue  Lipitor 40 mg and daily aspirin  -counsel on smoking cessation   Thyromegaly: Present on CTA chest. New finding since previous imaging in 2009.  -TSH <0.010 and T4 elevated to 5.24. T3 also elevated to 557.   -Thyroid US >> enlarged thyroid wo discrete nodules.  -outpatient follow up  Prolonged QT .  On EKG obtained in ED noted to be 470.   -Avoid medications that prolong QT   Seasonal allergies:  At home on Claritin.  -Continue Claritin 10 mg daily   Social.  Patient does not have a PCP and has not seen a doctor in >10 years.   -Will schedule follow up appointment at [facility] with me to establish as PCP  FEN/GI: Regular, SLIV Prophylaxis: Lovenox  Disposition: Dispo pending clinical improvement.   Subjective:  Patient denies cough, and shortness of breath.  No chest pain or leg swelling.   Objective: Temp:  [98.1 F (36.7 C)-98.4 F (36.9 C)] 98.1 F (36.7 C) (03/30 0431) Pulse Rate:  [91-97] 93 (03/30 1031) Resp:  [18-24] 18 (03/30 0431) BP: (113-138)/(51-67) 127/57 (03/30 1031) SpO2:  [93 %-98 %] 98 % (03/30 0957) Weight:  [117 lb 14.4 oz (53.5 kg)] 117 lb 14.4 oz (53.5 kg) (03/30 0431)   Physical Exam: General: 56 year old female sitting on hospital bed, in NAD  Eyes: PERRL, EOMI  ENTM: MMM, o/p clear  Neck: supple, no JVD  Cardiovascular: RRR no MRG, palpable pulses Respiratory: CTA with no crackles  appreciated, comfortable work of breathing Gastrointestinal: soft, NT, ND, +bs  Derm: skin is warm and dry Extremities: no edema or tenderness noted   Neuro: AAOx3, no focal deficits, 5/5 strength in upper and LE bilaterally  Psych: normal affect   Laboratory:  Recent Labs Lab 02/24/17 0516 02/25/17 0451 02/26/17 0446  WBC 6.5 8.4 7.6  HGB 8.8* 8.8* 8.7*  HCT 27.5* 27.8* 27.9*  PLT 147* 143* 146*    Recent Labs Lab 02/23/17 0930  02/24/17 0516 02/25/17 0451 02/26/17 0446  NA 142  < > 145 141 142  K 2.4*  < > 3.4* 4.6 4.4  CL 104  < > 103 107 106  CO2 28  < > '31 27 26  ' BUN 7  < > 7 9 5*  CREATININE 0.32*  < > 0.34* 0.35* <0.30*  CALCIUM 8.8*  < > 9.0 9.3 9.2  PROT 5.6*  --   --   --   --  BILITOT 1.2  --   --   --   --   ALKPHOS 170*  --   --   --   --   ALT 15  --   --   --   --   AST 24  --   --   --   --   GLUCOSE 101*  < > 94 93 85  < > = values in this interval not displayed.   Imaging/Diagnostic Tests: No results found. Lovenia Kim, MD 02/26/2017, 11:51 AM PGY-1, [place] Intern pager: [phone], text pages welcome

## 2017-02-26 NOTE — Progress Notes (Signed)
All d/c instructions explained and given to pt.  Verbalized understanding. .  Rosaelena Kemnitz, RN. 

## 2017-02-28 LAB — CULTURE, RESPIRATORY W GRAM STAIN: Culture: NORMAL

## 2017-03-03 ENCOUNTER — Ambulatory Visit (INDEPENDENT_AMBULATORY_CARE_PROVIDER_SITE_OTHER): Payer: Managed Care, Other (non HMO) | Admitting: Family Medicine

## 2017-03-03 ENCOUNTER — Encounter: Payer: Self-pay | Admitting: Family Medicine

## 2017-03-03 VITALS — BP 150/58 | HR 103 | Temp 98.1°F | Ht 64.0 in | Wt 111.0 lb

## 2017-03-03 DIAGNOSIS — Z114 Encounter for screening for human immunodeficiency virus [HIV]: Secondary | ICD-10-CM

## 2017-03-03 DIAGNOSIS — Z1159 Encounter for screening for other viral diseases: Secondary | ICD-10-CM | POA: Diagnosis not present

## 2017-03-03 DIAGNOSIS — I5021 Acute systolic (congestive) heart failure: Secondary | ICD-10-CM

## 2017-03-03 DIAGNOSIS — J180 Bronchopneumonia, unspecified organism: Secondary | ICD-10-CM

## 2017-03-03 DIAGNOSIS — I7090 Unspecified atherosclerosis: Secondary | ICD-10-CM

## 2017-03-03 DIAGNOSIS — F17200 Nicotine dependence, unspecified, uncomplicated: Secondary | ICD-10-CM

## 2017-03-03 DIAGNOSIS — E876 Hypokalemia: Secondary | ICD-10-CM

## 2017-03-03 NOTE — Progress Notes (Signed)
Subjective:   Patient ID: [name]    DOB: 1961-07-17, 56 y.o. female   MRN: [id number]  CC: hospital follow up  HPI: [name] is a 56 y.o. female who presents to clinic today for hospital follow-up.  Was recently admitted on 02/23/2017 for shortness of breath and discharged on 02/26/2017 in stable condition.    New CHF With echo done inpatient and EF 50-55%, G2DD.   Reports her respiratory status today is better than previously. Shortness of breath is overall improved but she still does have some persistent cough productive of brown sputum.   Has been using nebulizer budesonide BID.  Was discharged home with Lasix 20 mg daily. Has been taking it everyday and reports good compliance.  Denies leg swelling.     Hypokalemia On admission K+ of 2.4.  Potassium was repleted and remained stable during hospitalization. Will check BMET today.   Tobacco use Smoking cessation counsel - smokes <1 PPD.  Has never tried to quit in the past.  Is interested in quitting.  Will discuss at next visit.   Cough/Emphysema Discharged on Pulmicort BID and prescription given for albuterol inhaler prn.  Has picked up and is using both.   Additionally, discharged with  total of 7 days antibiotics for suspected CAP: AZT 250 mg daily and Amox 500 mg TID.  Has completed both courses.    Atherosclerosis on CT chest Started on daily aspirin and Lipitor 40 mg during this hospitalization due to evidence of atherosclerosis on CT chest, somewhat advanced for age.  Reports she is taking both.    Thyroid Will need outpatient workup.  Thyromegaly present on CTA chest, TSH <0.010, T3 557, T4 5.24.  Thyroid US revealed enlarged thyroid wo discrete nodules.   ROS: Denies fevers, chills, nausea, vomiting, diarrhea, abdominal pain, cough.   PMFSH: Pertinent past medical, surgical, family, and social history were reviewed and updated as appropriate. Smoking status reviewed.  Medications reviewed.  Objective:   BP (!)  150/58   Pulse (!) 103   Temp 98.1 F (36.7 C) (Oral)   Ht  (1.626 m)   Wt 111 lb (50.3 kg)   SpO2 91%   BMI 19.05 kg/m  Vitals and nursing note reviewed.  General: thin 56 yo F, chronically ill appearing, in no acute distress HEENT:NCAT, MMM, o/p clear  Neck: supple CV: RRR no MRG , palpable pulses, no JVD Lungs: CTA B/L, comfortable work of breathing, no crackles appreciated on lung exam Abdomen: soft, non-tender, +bs Skin: warm, dry Extremities: warm and well perfused, normal tone Psych: normal mood and affect   Assessment & Plan:    Acute systolic congestive heart failure (HCC) Stable.  With no signs of fluid overload on exam.  No leg swelling or crackles appreciated.  -Continue current regimen: Lasix 20 mg daily.  -Monitor fluid status  -Check BMET today  Hypokalemia On hospital admission with K+ of 2.4 and repleted.  Stable throughout remainder of hospitalization and on discharge.  -Will check BMET today   Tobacco use disorder Currently smokes <1 PPD.  Interested in quitting.  -Will discuss cessation at next visit   Atherosclerosis Evidence of atherosclerosis on CT chest during recent hospitalization.  Noted to be somewhat advanced for age.   -Started on daily aspirin and Lipitor 40 mg  -Patient reports good compliance   Bronchopneumonia Peripheral wedge-shaped opacity in RUL on CXR.  CT with cardiomegaly, possible mild interstitial edema with trace fusions and RUL peribronchial nodularity  in confluent ground-glass opacity most likely bronchopneumonia.  -Concern for infiltrate vs malignancy due to significant smoking history.   -Broncoscopy not performed this hospitalization and treated with course of antibiotics.  -Will need CT chest in 3 months to follow up, and CXR in 6 weeks to re-assess.    Health Maintenance: -Hep C screen -HIV antibody -Discuss smoking cessation at next visit   Orders Placed This Encounter  Procedures  . Basic Metabolic  Panel  . Hepatitis C antibody  . HIV antibody   Follow up: 4 weeks   Freddrick March, MD [facility], PGY-1 03/08/2017 9:22 PM

## 2017-03-03 NOTE — Patient Instructions (Addendum)
It was nice seeing you in clinic today! We discussed your progress after discharge and it appears you are doing really well.  I would recommend continuing your Lasix 20 mg daily as this is an appropriate dose for you.  In addition, I obtained some labs today to check your kidney function as well as your Potassium level which was low when you were in the hospital.    Additionally, we discussed smoking cessation and some health maintenance issues we can address today.  I am happy you are interested in quitting smoking and I would like for you to make an appointment at the front desk to follow up with Dr. Michaelyn Barter for this.  We screened for Hepatitis C and HIV.  I will follow up with you with the results of these.  Please call clinic with any questions.   Be well,  Freddrick March, MD

## 2017-03-04 ENCOUNTER — Telehealth: Payer: Self-pay | Admitting: Family Medicine

## 2017-03-04 LAB — HIV ANTIBODY (ROUTINE TESTING W REFLEX): HIV Screen 4th Generation wRfx: NONREACTIVE

## 2017-03-04 LAB — BASIC METABOLIC PANEL
BUN/Creatinine Ratio: 16 (ref 9–23)
BUN: 10 mg/dL (ref 6–24)
CO2: 26 mmol/L (ref 18–29)
Calcium: 9.1 mg/dL (ref 8.7–10.2)
Chloride: 101 mmol/L (ref 96–106)
Creatinine, Ser: 0.63 mg/dL (ref 0.57–1.00)
GFR calc Af Amer: 118 mL/min/{1.73_m2} (ref >59)
GFR calc non Af Amer: 102 mL/min/{1.73_m2} (ref >59)
Glucose: 99 mg/dL (ref 65–99)
Potassium: 3.9 mmol/L (ref 3.5–5.2)
Sodium: 141 mmol/L (ref 134–144)

## 2017-03-04 LAB — HEPATITIS C ANTIBODY: Hep C Virus Ab: <0.1 s/co ratio (ref 0.0–0.9)

## 2017-03-04 NOTE — Telephone Encounter (Signed)
Called patient to discuss results of bloodwork from 03/03/2017.  K+ wnl on BMET, with HIV and Hep C negative.  Patient appreciative of the call.

## 2017-03-08 DIAGNOSIS — F17200 Nicotine dependence, unspecified, uncomplicated: Secondary | ICD-10-CM | POA: Insufficient documentation

## 2017-03-08 DIAGNOSIS — I7090 Unspecified atherosclerosis: Secondary | ICD-10-CM | POA: Insufficient documentation

## 2017-03-08 DIAGNOSIS — Z720 Tobacco use: Secondary | ICD-10-CM | POA: Insufficient documentation

## 2017-03-08 NOTE — Assessment & Plan Note (Signed)
Evidence of atherosclerosis on CT chest during recent hospitalization.  Noted to be somewhat advanced for age.   -Started on daily aspirin and Lipitor 40 mg  -Patient reports good compliance

## 2017-03-08 NOTE — Assessment & Plan Note (Signed)
Stable.  With no signs of fluid overload on exam.  No leg swelling or crackles appreciated.  -Continue current regimen: Lasix 20 mg daily.  -Monitor fluid status  -Check BMET today

## 2017-03-08 NOTE — Assessment & Plan Note (Signed)
Currently smokes <1 PPD.  Interested in quitting.  -Will discuss cessation at next visit

## 2017-03-08 NOTE — Assessment & Plan Note (Signed)
Peripheral wedge-shaped opacity in RUL on CXR.  CT with cardiomegaly, possible mild interstitial edema with trace fusions and RUL peribronchial nodularity in confluent ground-glass opacity most likely bronchopneumonia.  -Concern for infiltrate vs malignancy due to significant smoking history.   -Broncoscopy not performed this hospitalization and treated with course of antibiotics.  -Will need CT chest in 3 months to follow up, and CXR in 6 weeks to re-assess.

## 2017-03-08 NOTE — Assessment & Plan Note (Signed)
On hospital admission with K+ of 2.4 and repleted.  Stable throughout remainder of hospitalization and on discharge.  -Will check BMET today

## 2017-04-05 ENCOUNTER — Encounter: Payer: Self-pay | Admitting: Family Medicine

## 2017-04-05 ENCOUNTER — Ambulatory Visit (INDEPENDENT_AMBULATORY_CARE_PROVIDER_SITE_OTHER): Payer: Managed Care, Other (non HMO) | Admitting: Family Medicine

## 2017-04-05 VITALS — BP 130/60 | HR 91 | Temp 98.2°F | Ht 64.0 in | Wt 116.0 lb

## 2017-04-05 DIAGNOSIS — E876 Hypokalemia: Secondary | ICD-10-CM

## 2017-04-05 DIAGNOSIS — F17200 Nicotine dependence, unspecified, uncomplicated: Secondary | ICD-10-CM

## 2017-04-05 DIAGNOSIS — I5021 Acute systolic (congestive) heart failure: Secondary | ICD-10-CM

## 2017-04-05 DIAGNOSIS — J180 Bronchopneumonia, unspecified organism: Secondary | ICD-10-CM

## 2017-04-05 MED ORDER — FUROSEMIDE 20 MG PO TABS: 20.0000 mg | ORAL_TABLET | Freq: Two times a day (BID) | ORAL | 2 refills | Status: DC

## 2017-04-05 NOTE — Patient Instructions (Addendum)
It was nice seeing you again ! You were seen in clinic for follow up on labs and for your lower leg swelling.  As we discussed, we will go up on your Lasix to 20 mg twice daily.  Please follow up with me next week to make sure this is a good adjustment for you.  At this visit I will also send you for a chest x-ray to check your lungs.   In addition, we discussed quitting smoking and I am glad you are interested in doing this.  I will have you scheduled with Dr. Michaelyn BarterKovall to work on this.  This number is also helpful for resources: 1-800 QUIT NOW.   NOTE to front office:  Please make an appointment for patient to follow up with Dr. Michaelyn BarterKovall for smoking cessation.

## 2017-04-05 NOTE — Progress Notes (Signed)
   Subjective:   Patient ID: [name]    DOB: [birth year], 56 y.o. female   MRN: [id number]  CC: smoking cessation visit, follow up hypokalemia  HPI: [name] is a 56 y.o. female who presents to clinic today to check labs and discuss smoking cessation.   CHF Reports she has been feeling well.  Denies SOB, however she has noticed that her lower legs and feet have swollen a bit and her feet "feel tight."  She also notes that she is 5 lbs up since last visit with me.  At home on 20 mg daily.  Has been peeing quite a bit on this dose. Reports good compliance.   Hypokalemia On Lasix 20 mg daily at home.   K+ stable at OV on 4/4 but will recheck it today as we are giving her Lasix.    Smoking cessation Interested in quitting smoking but has not attempted it in the past. She is a current everyday smoker.  Smokes about 1/2 PPD.  Previously was smoking at least 1 PPD since age 89.  She states that a good reason for her to quit would be cost.   Grandchild lives with her so she does not smoke in the house.   She does not want to try Chantix because of side effect of nightmares she has heard about.    ROS: Denies fevers, chills, nausea, vomiting, diarrhea.  Denies shortness of breath and chest pain.    Smoking status reviewed.  Patient is a current everyday smoker.  Medications reviewed.  Objective:   BP 130/60   Pulse 91   Temp 98.2 F (36.8 C) (Oral)   Ht 5\' 4"  (1.626 m)   Wt 116 lb (52.6 kg)   SpO2 95%   BMI 19.91 kg/m  Vitals and nursing note reviewed.  General: tired appearing 56 yo F, in NAD HEENT: NCAT, o/p clear, MMM Neck: supple, no JVD CV: RRR no MRG  Lungs: CTAB, normal WOB  Abdomen: soft, NTND, +bs Skin: warm, dry Extremities: 1+ pitting edema bilateral LE, no warmth, tenderness or calf pain   Assessment & Plan:    Acute systolic congestive heart failure (HCC) Stable.  Taking 20 mg Lasix daily and reports good compliance.  Denies SOB, however has noticed some  lower leg and feet swelling.  Weight up 5 lbs since last office visit.  On exam, 1+ pitting edema noted.   -Will increase frequency to 20 mg BID with follow up next week to see if improvement -Check BMET for K  Tobacco use disorder -Patient expresses interest to quit smoking.  Current everyday smoker, smokes about 1/2-1 PPD.  Discussed previous attempts, reasons to quit and reviewed additional resources for her -To make appointment with Dr. Michaelyn BarterKovall -[phone] QUIT NOW number provided  Bronchopneumonia Will need CT chest around 05/30/2017 and CXR around 04/13/2017 to re-assess for improvement.   Orders Placed This Encounter  Procedures  . Basic Metabolic Panel   Meds ordered this encounter  Medications  . furosemide (LASIX) 20 MG tablet    Sig: Take 1 tablet (20 mg total) by mouth 2 (two) times daily.    Dispense:  30 tablet    Refill:  2   Follow up: 1 week   Freddrick MarchYashika Devlynn Knoff, MD [facility], PGY-1 04/10/2017 9:54 PM

## 2017-04-06 LAB — BASIC METABOLIC PANEL
BUN/Creatinine Ratio: 29 — ABNORMAL HIGH (ref 9–23)
BUN: 10 mg/dL (ref 6–24)
CO2: 28 mmol/L (ref 18–29)
Calcium: 8.7 mg/dL (ref 8.7–10.2)
Chloride: 99 mmol/L (ref 96–106)
Creatinine, Ser: 0.35 mg/dL — ABNORMAL LOW (ref 0.57–1.00)
GFR calc Af Amer: 143 mL/min/{1.73_m2} (ref >59)
GFR calc non Af Amer: 124 mL/min/{1.73_m2} (ref >59)
Glucose: 94 mg/dL (ref 65–99)
Potassium: 3.3 mmol/L — ABNORMAL LOW (ref 3.5–5.2)
Sodium: 144 mmol/L (ref 134–144)

## 2017-04-10 NOTE — Assessment & Plan Note (Signed)
Stable.  Taking 20 mg Lasix daily and reports good compliance.  Denies SOB, however has noticed some lower leg and feet swelling.  Weight up 5 lbs since last office visit.  On exam, 1+ pitting edema noted.   -Will increase frequency to 20 mg BID with follow up next week to see if improvement -Check BMET for K

## 2017-04-10 NOTE — Assessment & Plan Note (Signed)
-  Patient expresses interest to quit smoking.  Current everyday smoker, smokes about 1/2-1 PPD.  Discussed previous attempts, reasons to quit and reviewed additional resources for her -To make appointment with Dr. Michaelyn BarterKovall -[phone] QUIT NOW number provided

## 2017-04-10 NOTE — Assessment & Plan Note (Signed)
Will need CT chest around 05/30/2017 and CXR around 04/13/2017 to re-assess for improvement.

## 2017-04-13 ENCOUNTER — Ambulatory Visit (INDEPENDENT_AMBULATORY_CARE_PROVIDER_SITE_OTHER): Payer: Managed Care, Other (non HMO) | Admitting: Family Medicine

## 2017-04-13 ENCOUNTER — Encounter: Payer: Self-pay | Admitting: Family Medicine

## 2017-04-13 VITALS — BP 132/52 | HR 90 | Temp 98.1°F | Ht 64.0 in | Wt 114.6 lb

## 2017-04-13 DIAGNOSIS — E876 Hypokalemia: Secondary | ICD-10-CM

## 2017-04-13 DIAGNOSIS — R600 Localized edema: Secondary | ICD-10-CM | POA: Diagnosis not present

## 2017-04-13 DIAGNOSIS — Z79899 Other long term (current) drug therapy: Secondary | ICD-10-CM

## 2017-04-13 DIAGNOSIS — E0590 Thyrotoxicosis, unspecified without thyrotoxic crisis or storm: Secondary | ICD-10-CM | POA: Diagnosis not present

## 2017-04-13 MED ORDER — METOPROLOL SUCCINATE ER 25 MG PO TB24: 12.5000 mg | ORAL_TABLET | Freq: Every day | ORAL | 1 refills | Status: DC

## 2017-04-13 MED ORDER — POTASSIUM CHLORIDE ER 10 MEQ PO TBCR: 10.0000 meq | EXTENDED_RELEASE_TABLET | Freq: Every day | ORAL | 1 refills | Status: DC

## 2017-04-13 NOTE — Assessment & Plan Note (Signed)
Improved: Deemed likely secondary to hyperthyroidism. Improved with furosemide. - Continue Lasix at current dose - Obtain BMP today.

## 2017-04-13 NOTE — Progress Notes (Signed)
   HPI  CC: Peripheral edema Patient is here for follow-up on peripheral edema. She states that she was seen recently by her PCP who informed her to increase the frequency of her Lasix. Since that time she has had improvement in the swelling in her legs. She denies any other symptoms at this time. She feels well overall.  Hyperthyroidism: During my exam I had noticed a significantly enlarged thyroid. She and I discussed this at length. Patient states that she had never been told that she had an enlarged thyroid but had been told in the past that her thyroid levels were elevated. She states that when her thyroid levels were found to be elevated she underwent an ultrasound which showed no nodules. Since that time no additional care has been provided regarding her thyroid. Patient endorses recent weight loss. Patient denies any shortness of breath, dysphasia, headache, blurred vision, diaphoresis, cold/heat intolerance, dizziness, vertigo, palpitations, shortness of breath, chest pain, nausea, vomiting, diarrhea, or anxiety/panic.  Review of Systems See HPI for ROS.   CC, SH/smoking status, and VS noted  Objective: BP (!) 132/52   Pulse 90   Temp 98.1 F (36.7 C) (Oral)   Ht 5\' 4"  (1.626 m)   Wt 114 lb 9.6 oz (52 kg)   SpO2 93%   BMI 19.67 kg/m  Gen: NAD, alert, cooperative, and pleasant. HEENT: NCAT, EOMI, PERRL, MMM, significantly enlarged smooth thyroid, no evidence of exophthalmos, no LAD, neck full ROM CV: RRR, no murmur, cardiac heave noted Resp: CTAB, no wheezes, non-labored Ext: +1 edema bilaterally to her shins, warm, pulses intact bilaterally Neuro: Alert and oriented, Speech clear, No gross deficits   Assessment and plan:  Hyperthyroidism Patient is here with symptoms and previous lab results consistent with hyperthyroidism. Recent labs showed TSH extremely low, and T3/T4 extraordinarily high. Ultrasound of her thyroid showed enlargement without nodularity. Many of these  findings are consistent with Graves' disease.  - Referral to endocrinology - Attempted to order labs to rule in/out Graves' disease but was unable to find this in our system - Initiate metoprolol, low-dose - Discussed return precautions and red flag symptoms.  Lower extremity edema Improved: Deemed likely secondary to hyperthyroidism. Improved with furosemide. - Continue Lasix at current dose - Obtain BMP today.  Hypokalemia Previously low. Increased dose of furosemide likely caused an additional drop. - Initiate potassium supplementation - Follow-up in 2 weeks for reevaluation and repeat BMP.   Orders Placed This Encounter  Procedures  . Basic Metabolic Panel  . Ambulatory referral to Endocrinology    Referral Priority:   Routine    Referral Type:   Consultation    Referral Reason:   Specialty Services Required    Number of Visits Requested:   1    Meds ordered this encounter  Medications  . potassium chloride (K-DUR) 10 MEQ tablet    Sig: Take 1 tablet (10 mEq total) by mouth daily.    Dispense:  30 tablet    Refill:  1  . metoprolol succinate (TOPROL-XL) 25 MG 24 hr tablet    Sig: Take 0.5 tablets (12.5 mg total) by mouth daily.    Dispense:  45 tablet    Refill:  1     Kathee DeltonIan D McKeag, MD,MS,  PGY3 04/13/2017 7:14 PM

## 2017-04-13 NOTE — Patient Instructions (Signed)
It was a pleasure seeing you today in our clinic. Today we discussed your meds and thyroid. Here is the treatment plan we have discussed and agreed upon together:   - I have referred you to a endocrinologist. They should be contacting you within the next 1-2 weeks to schedule an appointment. - Take the Potassium tablet once a day, every day. - Take the 1/2 tablet of the Metoprolol once a day, every day. - Come back to follow up with Dr. Nelson ChimesAmin in about 2 weeks.   Hyperthyroidism Hyperthyroidism is when the thyroid is too active (overactive). Your thyroid is a large gland that is located in your neck. The thyroid helps to control how your body uses food (metabolism). When your thyroid is overactive, it produces too much of a hormone called thyroxine. What are the causes? Causes of hyperthyroidism may include:  Graves disease. This is when your immune system attacks the thyroid gland. This is the most common cause.  Inflammation of the thyroid gland.  Tumor in the thyroid gland or somewhere else.  Excessive use of thyroid medicines, including:  Prescription thyroid supplement.  Herbal supplements that mimic thyroid hormones.  Solid or fluid-filled lumps within your thyroid gland (thyroid nodules).  Excessive ingestion of iodine. What increases the risk?  Being female.  Having a family history of thyroid conditions. What are the signs or symptoms? Signs and symptoms of hyperthyroidism may include:  Nervousness.  Inability to tolerate heat.  Unexplained weight loss.  Diarrhea.  Change in the texture of hair or skin.  Heart skipping beats or making extra beats.  Rapid heart rate.  Loss of menstruation.  Shaky hands.  Fatigue.  Restlessness.  Increased appetite.  Sleep problems.  Enlarged thyroid gland or nodules. How is this diagnosed? Diagnosis of hyperthyroidism may include:  Medical history and physical exam.  Blood tests.  Ultrasound tests. How is  this treated? Treatment may include:  Medicines to control your thyroid.  Surgery to remove your thyroid.  Radiation therapy. Follow these instructions at home:  Take medicines only as directed by your health care provider.  Do not use any tobacco products, including cigarettes, chewing tobacco, or electronic cigarettes. If you need help quitting, ask your health care provider.  Do not exercise or do physical activity until your health care provider approves.  Keep all follow-up appointments as directed by your health care provider. This is important. Contact a health care provider if:  Your symptoms do not get better with treatment.  You have fever.  You are taking thyroid replacement medicine and you:  Have depression.  Feel mentally and physically slow.  Have weight gain. Get help right away if:  You have decreased alertness or a change in your awareness.  You have abdominal pain.  You feel dizzy.  You have a rapid heartbeat.  You have an irregular heartbeat. This information is not intended to replace advice given to you by your health care provider. Make sure you discuss any questions you have with your health care provider. Document Released: 11/16/2005 Document Revised: 04/16/2016 Document Reviewed: 04/03/2014 Elsevier Interactive Patient Education  2017 [employer]

## 2017-04-13 NOTE — Assessment & Plan Note (Addendum)
Patient is here with symptoms and previous lab results consistent with hyperthyroidism. Recent labs showed TSH extremely low, and T3/T4 extraordinarily high. Ultrasound of her thyroid showed enlargement without nodularity. Many of these findings are consistent with Graves' disease.  - Referral to endocrinology - Attempted to order labs to rule in/out Graves' disease but was unable to find this in our system - Initiate metoprolol, low-dose - Discussed return precautions and red flag symptoms.

## 2017-04-13 NOTE — Assessment & Plan Note (Addendum)
Previously low. Increased dose of furosemide likely caused an additional drop. - Initiate potassium supplementation - Follow-up in 2 weeks for reevaluation and repeat BMP.

## 2017-04-14 LAB — BASIC METABOLIC PANEL
BUN/Creatinine Ratio: 32 — ABNORMAL HIGH (ref 9–23)
BUN: 10 mg/dL (ref 6–24)
CO2: 27 mmol/L (ref 18–29)
Calcium: 8.9 mg/dL (ref 8.7–10.2)
Chloride: 102 mmol/L (ref 96–106)
Creatinine, Ser: 0.31 mg/dL — ABNORMAL LOW (ref 0.57–1.00)
GFR calc Af Amer: 149 mL/min/{1.73_m2} (ref >59)
GFR calc non Af Amer: 129 mL/min/{1.73_m2} (ref >59)
Glucose: 88 mg/dL (ref 65–99)
Potassium: 3.4 mmol/L — ABNORMAL LOW (ref 3.5–5.2)
Sodium: 146 mmol/L — ABNORMAL HIGH (ref 134–144)

## 2017-04-27 ENCOUNTER — Encounter: Payer: Self-pay | Admitting: Family Medicine

## 2017-04-27 ENCOUNTER — Ambulatory Visit (INDEPENDENT_AMBULATORY_CARE_PROVIDER_SITE_OTHER): Payer: Managed Care, Other (non HMO) | Admitting: Family Medicine

## 2017-04-27 VITALS — BP 128/62 | HR 90 | Temp 98.2°F | Wt 115.0 lb

## 2017-04-27 DIAGNOSIS — E876 Hypokalemia: Secondary | ICD-10-CM

## 2017-04-27 DIAGNOSIS — E0590 Thyrotoxicosis, unspecified without thyrotoxic crisis or storm: Secondary | ICD-10-CM

## 2017-04-27 NOTE — Assessment & Plan Note (Addendum)
Patient presenting for lab recheck for hypokalemia after initiating potassium supplementation. She continues to take Lasix twice a day. Denies any symptoms at this time (other than fatigue). - BMP obtained today - Patient asked to follow-up in one month with PCP - Patient may benefit from decreasing Lasix in the future once hyperthyroidism is under control. She will need this reduced sooner if she develops any signs or symptoms of kidney injury. - Consider repeat BMP at next visit

## 2017-04-27 NOTE — Assessment & Plan Note (Signed)
Stable: Patient continued to have fatigue but no significant changes in symptoms at this time. Tolerating metoprolol well. - Continue metoprolol - Patient to see endocrinology in <1 month - Patient asked to follow-up with PCP shortly after endocrinology appointment.

## 2017-04-27 NOTE — Progress Notes (Signed)
   HPI  CC: Hyperthyroidism and high-risk medication use. Patient is here following up to recheck her labs today. Patient is currently on Lasix twice a day. At the last visit a BMP was drawn which showed relatively stable hypokalemia. She was started on potassium supplementation and asked to follow-up. She states that she has been compliant with these medications and has had no adverse side effects at this time. She states she feels well overall.  Hyperthyroidism: At the last visit patient was referred to endocrinology. Patient states that she was able to set up an appointment in June and is looking forward to having this checked by a specialist. She was also started on metoprolol at the last visit. She endorses no adverse side effects with this medication. She denies any symptoms of palpitations, chest pain, shortness of breath, new edema, dizziness, or confusion. She continues to endorse issues with fatigue which is unchanged from the last visit.  Review of Systems See HPI for ROS.   CC, SH/smoking status, and VS noted  Objective: BP 128/62   Pulse 90   Temp 98.2 F (36.8 C) (Oral)   Wt 115 lb (52.2 kg)   SpO2 94%   BMI 19.74 kg/m  Gen: NAD, alert, cooperative, very thin, and pleasant. HEENT: NCAT, EOMI, PERRL, MMM, significantly enlarged smooth thyroid, no evidence of exophthalmos, no LAD, neck full ROM CV: RRR, no murmur, cardiac heave noted Resp: CTAB, no wheezes, non-labored Ext: improved edema bilaterally but still slightly present, warm, pulses intact bilaterally Neuro: Alert and oriented, Speech clear, No gross deficits   Assessment and plan:  Hypokalemia Patient presenting for lab recheck for hypokalemia after initiating potassium supplementation. She continues to take Lasix twice a day. Denies any symptoms at this time (other than fatigue). - BMP obtained today - Patient asked to follow-up in one month with PCP - Patient may benefit from decreasing Lasix in the future  once hyperthyroidism is under control. She will need this reduced sooner if she develops any signs or symptoms of kidney injury. - Consider repeat BMP at next visit  Hyperthyroidism Stable: Patient continued to have fatigue but no significant changes in symptoms at this time. Tolerating metoprolol well. - Continue metoprolol - Patient to see endocrinology in <1 month - Patient asked to follow-up with PCP shortly after endocrinology appointment.   Orders Placed This Encounter  Procedures  . Basic Metabolic Panel    Kathee DeltonIan D Ardit Danh, MD,MS,  PGY3 04/27/2017 6:00 PM

## 2017-04-27 NOTE — Patient Instructions (Signed)
It was a pleasure seeing you today in our clinic. Today we discussed your medications and thyroid. Here is the treatment plan we have discussed and agreed upon together:   - Continue taking your medications as prescribed. - I will contact you if your labs today have any unsuspected abnormalities that we need to discuss. - I would like to have a follow-up in one month, a few days after your endocrinology appointment would be ideal, with your primary care provider.

## 2017-04-28 LAB — BASIC METABOLIC PANEL
BUN/Creatinine Ratio: 41 — ABNORMAL HIGH (ref 9–23)
BUN: 12 mg/dL (ref 6–24)
CO2: 28 mmol/L (ref 18–29)
Calcium: 8.9 mg/dL (ref 8.7–10.2)
Chloride: 102 mmol/L (ref 96–106)
Creatinine, Ser: 0.29 mg/dL — ABNORMAL LOW (ref 0.57–1.00)
GFR calc Af Amer: 152 mL/min/{1.73_m2} (ref >59)
GFR calc non Af Amer: 132 mL/min/{1.73_m2} (ref >59)
Glucose: 112 mg/dL — ABNORMAL HIGH (ref 65–99)
Potassium: 3.9 mmol/L (ref 3.5–5.2)
Sodium: 142 mmol/L (ref 134–144)

## 2017-05-25 ENCOUNTER — Encounter: Payer: Self-pay | Admitting: Endocrinology

## 2017-05-25 ENCOUNTER — Ambulatory Visit (INDEPENDENT_AMBULATORY_CARE_PROVIDER_SITE_OTHER): Payer: Managed Care, Other (non HMO) | Admitting: Endocrinology

## 2017-05-25 VITALS — BP 120/60 | HR 89 | Ht 64.0 in | Wt 114.0 lb

## 2017-05-25 DIAGNOSIS — E0590 Thyrotoxicosis, unspecified without thyrotoxic crisis or storm: Secondary | ICD-10-CM | POA: Diagnosis not present

## 2017-05-25 MED ORDER — METHIMAZOLE 10 MG PO TABS: 40.0000 mg | ORAL_TABLET | Freq: Two times a day (BID) | ORAL | 1 refills | Status: DC

## 2017-05-25 NOTE — Patient Instructions (Addendum)
I have sent a prescription to your pharmacy, to slow the thyroid. If ever you have fever while taking methimazole, stop it and call us, even if the reason is obvious, because of the risk of a rare side-effect. You can take the radioactive iodine at a later date if you want.  Please come back for a follow-up appointment in 2-3 weeks.       Radioiodine (I-131) Therapy for Hyperthyroidism Radioiodine (I-131) therapy is a procedure to treat an overactive thyroid gland (hyperthyroidism). The thyroid is a gland in the neck that uses iodine to help control how the body uses food (metabolism). In this procedure, you swallow a pill or liquid that contains I-131. I-131 is manufactured (synthetic) iodine that gives off radiation. This destroys thyroid cells and reverses hyperthyroidism. Tell a health care provider about:  Any allergies you have.  All medicines you are taking, including vitamins, herbs, eye drops, creams, and over-the-counter medicines.  Any problems you or family members have had with anesthetic medicines.  Any blood disorders you have.  Any surgeries you have had.  Any medical conditions you have.  Whether you are pregnant, may be pregnant, or have gone through menopause, if this applies.  Whether you currently have children.  Whether you plan to have children in the next 2 years.  Any contact you have with children or pregnant women.  Your travel plans for the next 3 months.  Whether you pass through radiation detectors for work or travel. What are the risks? Generally, this is a safe procedure. However, problems may occur, including:  Damage to other structures or organs, such as the salivary glands. This could lead to dry mouth and loss of taste.  Low sperm count, if this applies. This may lead to temporary infertility.  Sore throat or neck pain. This is temporary.  Slightlyincreased risk of thyroid cancer.  Nausea or vomiting.  What happens before the  procedure?  Ask your health care provider about changing or stopping your regular medicines. This is especially important if you are taking diabetes medicines, blood thinners, or thyroid medicines.  Women may be asked to take a pregnancy test.  Women who are breastfeeding should plan to stop at least 6 weeks before the procedure.  Follow instructions from your health care provider about eating or drinking restrictions.  Plan to avoid contact with others for 1 week after your treatment. It is most important to avoid contact with children and pregnant women. To do this, plan to stay home from work, arrange child care, and sleep alone, if these things apply to you.  Plan to drive yourself home after treatment. Do not take public transportation. If you need someone to drive you home, sit as far away from the driver as possible. What happens during the procedure?  You will be given a dose of I-131 to swallow. It may be a pill or a liquid.  Your thyroid gland will absorb the I-131 over the next 3 months. The treatment process will be complete in about 6 months. What happens after the procedure?  You may need to stay in the hospital for 24 hours after your treatment. This depends on the requirements in your state.  Follow instructions from your health care provider about: ? How to take care of yourself after the procedure. ? How to protect others from exposure to radiation as it leaves your body. This information is not intended to replace advice given to you by your health care provider. Make sure  you discuss any questions you have with your health care provider. Document Released: 04/04/2009 Document Revised: 04/21/2016 Document Reviewed: 03/13/2015 Elsevier Interactive Patient Education  [employer].

## 2017-05-25 NOTE — Progress Notes (Signed)
Subjective:    Patient ID: [name], female    DOB: Nov 22, 1961, 56 y.o.   MRN: [id number]  HPI Pt is referred by Dr Wende MottMcKeag, for hyperthyroidism.  Pt reports he was dx'ed with hyperthyroidism 3 mos ago.  she has never been on therapy for this.  she has never had XRT to the anterior neck, or thyroid surgery.  she has never had thyroid imaging.  she does not consume kelp or any other prescribed or non-prescribed thyroid medication.  she has never been on amiodarone.  Pt reports moderate swelling at the ant neck (R>L), but no assoc pain. Past Medical History:  Diagnosis Date  . Herniated disc     No past surgical history on file.  Social History   Social History  . Marital status: Married    Spouse name: N/A  . Number of children: N/A  . Years of education: N/A   Occupational History  . Not on file.   Social History Main Topics  . Smoking status: Current Every Day Smoker  . Smokeless tobacco: Never Used  . Alcohol use No  . Drug use: No  . Sexual activity: Not on file   Other Topics Concern  . Not on file   Social History Narrative  . No narrative on file    Current Outpatient Prescriptions on File Prior to Visit  Medication Sig Dispense Refill  . Acetaminophen-Caffeine (EXCEDRIN TENSION HEADACHE) 500-65 MG TABS Take 1 tablet by mouth daily as needed (pain).    Marland Kitchen. albuterol (PROVENTIL HFA;VENTOLIN HFA) 108 (90 Base) MCG/ACT inhaler Inhale 2 puffs into the lungs every 6 (six) hours as needed for wheezing or shortness of breath. 1 Inhaler 2  . aspirin EC 81 MG EC tablet Take 1 tablet (81 mg total) by mouth daily. 30 tablet 2  . atorvastatin (LIPITOR) 40 MG tablet Take 1 tablet (40 mg total) by mouth daily at 6 PM. 30 tablet 2  . budesonide (PULMICORT) 0.25 MG/2ML nebulizer solution Take 2 mLs (0.25 mg total) by nebulization 2 (two) times daily. 60 mL 12  . cetirizine (ZYRTEC) 10 MG tablet Take 10 mg by mouth daily.    . furosemide (LASIX) 20 MG tablet Take 1 tablet (20 mg  total) by mouth 2 (two) times daily. 30 tablet 2  . metoprolol succinate (TOPROL-XL) 25 MG 24 hr tablet Take 0.5 tablets (12.5 mg total) by mouth daily. 45 tablet 1  . potassium chloride (K-DUR) 10 MEQ tablet Take 1 tablet (10 mEq total) by mouth daily. 30 tablet 1  . ranitidine (ZANTAC) 150 MG tablet Take 150 mg by mouth daily.     No current facility-administered medications on file prior to visit.     Allergies  Allergen Reactions  . Other Rash    Per pt medication to treat MRSA - unsure of name     Family History  Problem Relation Age of Onset  . Thyroid disease Neg Hx     BP 120/60 (BP Location: Left Arm, Patient Position: Sitting)   Pulse 89   Ht 5\' 4"  (1.626 m)   Wt 114 lb (51.7 kg)   SpO2 96%   BMI 19.57 kg/m     Review of Systems denies fever, hoarseness, diplopia, sob, diarrhea, polyuria, muscle weakness, excessive diaphoresis, anxiety, and easy bruising.  She has lost approx 30 lbs x 1 year.  She has intermitt headache, tremor, rhinorrhea, heat intolerance, and palpitations     Objective:   Physical Exam VS: see  vs page GEN: no distress HEAD: head: no deformity eyes: no periorbital swelling, no proptosis external nose and ears are normal mouth: no lesion seen NECK: thyroid is 10x normal size, diffuse, with audible bruit CHEST WALL: no deformity LUNGS: clear to auscultation CV: reg rate and rhythm, no murmur ABD: abdomen is soft, nontender.  no hepatosplenomegaly.  not distended.  no hernia MUSCULOSKELETAL: muscle bulk and strength are grossly normal.  no obvious joint swelling.  gait is normal and steady EXTEMITIES: no deformity.  no edema PULSES: no carotid bruit NEURO:  cn 2-12 grossly intact.   readily moves all 4's.  sensation is intact to touch on all 4's.  SKIN:  Normal texture and temperature.  No rash or suspicious lesion is visible.  Moderate tremor of the hands NODES:  None palpable at the neck.  PSYCH: alert, well-oriented.  Does not appear  anxious nor depressed.    Lab Results  Component Value Date   TSH <0.010 (L) 02/23/2017   T3TOTAL 557 (H) 02/24/2017   [place]: Enlarged thyroid without discrete nodules.  I personally reviewed electrocardiogram tracing (02/23/17): Indication: CHF Impression: NSR.  No MI.  No hypertrophy.  LAE Comparison is available     Assessment & Plan:  Grave's Dz, new to me Hyperthyroidism, due to Grave's Dz.  we discussed rx options.  She is too hyperthyroid now for surgery or RAI, so tapazole is chosen.    Patient Instructions  I have sent a prescription to your pharmacy, to slow the thyroid. If ever you have fever while taking methimazole, stop it and call us, even if the reason is obvious, because of the risk of a rare side-effect. You can take the radioactive iodine at a later date if you want.  Please come back for a follow-up appointment in 2-3 weeks.       Radioiodine (I-131) Therapy for Hyperthyroidism Radioiodine (I-131) therapy is a procedure to treat an overactive thyroid gland (hyperthyroidism). The thyroid is a gland in the neck that uses iodine to help control how the body uses food (metabolism). In this procedure, you swallow a pill or liquid that contains I-131. I-131 is manufactured (synthetic) iodine that gives off radiation. This destroys thyroid cells and reverses hyperthyroidism. Tell a health care provider about:  Any allergies you have.  All medicines you are taking, including vitamins, herbs, eye drops, creams, and over-the-counter medicines.  Any problems you or family members have had with anesthetic medicines.  Any blood disorders you have.  Any surgeries you have had.  Any medical conditions you have.  Whether you are pregnant, may be pregnant, or have gone through menopause, if this applies.  Whether you currently have children.  Whether you plan to have children in the next 2 years.  Any contact you have with children or pregnant women.  Your travel  plans for the next 3 months.  Whether you pass through radiation detectors for work or travel. What are the risks? Generally, this is a safe procedure. However, problems may occur, including:  Damage to other structures or organs, such as the salivary glands. This could lead to dry mouth and loss of taste.  Low sperm count, if this applies. This may lead to temporary infertility.  Sore throat or neck pain. This is temporary.  Slightlyincreased risk of thyroid cancer.  Nausea or vomiting.  What happens before the procedure?  Ask your health care provider about changing or stopping your regular medicines. This is especially important if you are taking  diabetes medicines, blood thinners, or thyroid medicines.  Women may be asked to take a pregnancy test.  Women who are breastfeeding should plan to stop at least 6 weeks before the procedure.  Follow instructions from your health care provider about eating or drinking restrictions.  Plan to avoid contact with others for 1 week after your treatment. It is most important to avoid contact with children and pregnant women. To do this, plan to stay home from work, arrange child care, and sleep alone, if these things apply to you.  Plan to drive yourself home after treatment. Do not take public transportation. If you need someone to drive you home, sit as far away from the driver as possible. What happens during the procedure?  You will be given a dose of I-131 to swallow. It may be a pill or a liquid.  Your thyroid gland will absorb the I-131 over the next 3 months. The treatment process will be complete in about 6 months. What happens after the procedure?  You may need to stay in the hospital for 24 hours after your treatment. This depends on the requirements in your state.  Follow instructions from your health care provider about: ? How to take care of yourself after the procedure. ? How to protect others from exposure to radiation  as it leaves your body. This information is not intended to replace advice given to you by your health care provider. Make sure you discuss any questions you have with your health care provider. Document Released: 04/04/2009 Document Revised: 04/21/2016 Document Reviewed: 03/13/2015 Elsevier Interactive Patient Education  [employer].

## 2017-05-31 ENCOUNTER — Other Ambulatory Visit: Payer: Self-pay | Admitting: Family Medicine

## 2017-06-09 ENCOUNTER — Other Ambulatory Visit: Payer: Self-pay | Admitting: Family Medicine

## 2017-06-22 ENCOUNTER — Ambulatory Visit (INDEPENDENT_AMBULATORY_CARE_PROVIDER_SITE_OTHER): Payer: Managed Care, Other (non HMO) | Admitting: Endocrinology

## 2017-06-22 ENCOUNTER — Encounter: Payer: Self-pay | Admitting: Endocrinology

## 2017-06-22 VITALS — BP 130/60 | HR 69 | Wt 121.8 lb

## 2017-06-22 DIAGNOSIS — E0590 Thyrotoxicosis, unspecified without thyrotoxic crisis or storm: Secondary | ICD-10-CM | POA: Diagnosis not present

## 2017-06-22 LAB — TSH: TSH: 0.19 u[IU]/mL — ABNORMAL LOW (ref 0.35–4.50)

## 2017-06-22 LAB — T4, FREE: Free T4: 0.07 ng/dL — ABNORMAL LOW (ref 0.60–1.60)

## 2017-06-22 MED ORDER — METHIMAZOLE 10 MG PO TABS: 10.0000 mg | ORAL_TABLET | Freq: Two times a day (BID) | ORAL | 1 refills | Status: DC

## 2017-06-22 NOTE — Progress Notes (Signed)
   Subjective:    Patient ID: [name], female    DOB: 12/23/1960, 56 y.o.   MRN: [id number]  HPI Pt returns for f/u of hyperthyroidism (dx'ed in early 2018; she has never had thyroid imaging, but Grave's Dz is suggested by severity and phys exam; tapazole was chosen as initial rx, due to severity).  She has reduced tapazole to 10-BID.  pt states she feels better in general.  In particular, fatigue is less now.   Past Medical History:  Diagnosis Date  . Herniated disc     No past surgical history on file.  Social History   Social History  . Marital status: Married    Spouse name: N/A  . Number of children: N/A  . Years of education: N/A   Occupational History  . Not on file.   Social History Main Topics  . Smoking status: Current Every Day Smoker  . Smokeless tobacco: Never Used  . Alcohol use No  . Drug use: No  . Sexual activity: Not on file   Other Topics Concern  . Not on file   Social History Narrative  . No narrative on file    Current Outpatient Prescriptions on File Prior to Visit  Medication Sig Dispense Refill  . Acetaminophen-Caffeine (EXCEDRIN TENSION HEADACHE) 500-65 MG TABS Take 1 tablet by mouth daily as needed (pain).    Marland Kitchen. albuterol (PROVENTIL HFA;VENTOLIN HFA) 108 (90 Base) MCG/ACT inhaler Inhale 2 puffs into the lungs every 6 (six) hours as needed for wheezing or shortness of breath. 1 Inhaler 2  . aspirin 81 MG EC tablet TAKE 1 TABLET BY MOUTH DAILY 30 tablet 0  . atorvastatin (LIPITOR) 40 MG tablet TAKE 1 TABLET BY MOUTH DAILY AT 6 PM 30 tablet 0  . budesonide (PULMICORT) 0.25 MG/2ML nebulizer solution Take 2 mLs (0.25 mg total) by nebulization 2 (two) times daily. 60 mL 12  . cetirizine (ZYRTEC) 10 MG tablet Take 10 mg by mouth daily.    . furosemide (LASIX) 20 MG tablet TAKE 1 TABLET BY MOUTH TWICE DAILY 30 tablet 0  . potassium chloride (K-DUR) 10 MEQ tablet Take 1 tablet (10 mEq total) by mouth daily. 30 tablet 1  . ranitidine (ZANTAC) 150 MG  tablet Take 150 mg by mouth daily.     No current facility-administered medications on file prior to visit.     Allergies  Allergen Reactions  . Other Rash    Per pt medication to treat MRSA - unsure of name     Family History  Problem Relation Age of Onset  . Thyroid disease Neg Hx     BP 130/60   Pulse 69   Wt 121 lb 12.8 oz (55.2 kg)   SpO2 97%   BMI 20.91 kg/m   Review of Systems Denies fever.     Objective:   Physical Exam VITAL SIGNS:  See vs page.   GENERAL: no distress.  NECK: thyroid is 10x normal size, diffuse.   Lab Results  Component Value Date   TSH 0.19 (L) 06/22/2017   T3TOTAL 557 (H) 02/24/2017      Assessment & Plan:  Hyperthyroidism: much better.  She declines RAI.  Please continue the same tapazole

## 2017-06-22 NOTE — Patient Instructions (Signed)
Please stop taking the metoprolol, and:  blood tests are requested for you today.  We'll let you know about the results. If ever you have fever while taking methimazole, stop it and call us, even if the reason is obvious, because of the risk of a rare side-effect. Please come back for a follow-up appointment in 2 months.

## 2017-06-30 ENCOUNTER — Other Ambulatory Visit: Payer: Self-pay | Admitting: Family Medicine

## 2017-07-08 ENCOUNTER — Other Ambulatory Visit: Payer: Self-pay | Admitting: Family Medicine

## 2017-07-31 ENCOUNTER — Other Ambulatory Visit: Payer: Self-pay | Admitting: Endocrinology

## 2017-08-15 ENCOUNTER — Other Ambulatory Visit: Payer: Self-pay | Admitting: Family Medicine

## 2017-08-23 ENCOUNTER — Telehealth: Payer: Self-pay | Admitting: Endocrinology

## 2017-08-23 ENCOUNTER — Ambulatory Visit (INDEPENDENT_AMBULATORY_CARE_PROVIDER_SITE_OTHER): Payer: Managed Care, Other (non HMO) | Admitting: Endocrinology

## 2017-08-23 VITALS — BP 120/64 | HR 67 | Wt 123.6 lb

## 2017-08-23 DIAGNOSIS — E0590 Thyrotoxicosis, unspecified without thyrotoxic crisis or storm: Secondary | ICD-10-CM | POA: Diagnosis not present

## 2017-08-23 LAB — TSH: TSH: 0.11 u[IU]/mL — ABNORMAL LOW (ref 0.35–4.50)

## 2017-08-23 LAB — T4, FREE: Free T4: 1.05 ng/dL (ref 0.60–1.60)

## 2017-08-23 NOTE — Patient Instructions (Addendum)
blood tests are requested for you today.  We'll let you know about the results.   If ever you have fever while taking methimazole, stop it and call us, even if the reason is obvious, because of the risk of a rare side-effect.  Please come back for a follow-up appointment in 2 months.   

## 2017-08-23 NOTE — Telephone Encounter (Signed)
please call patient: These results are changing, but overall, you should continue to take 4 pills, twice a day. Please come back for a follow-up appointment in 2 months.

## 2017-08-23 NOTE — Progress Notes (Signed)
Subjective:    Patient ID: [name], female    DOB: June 17, 1961, 56 y.o.   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  she did not reduce the tapazole.  She took 40-BID, until she ran out, 1 week ago.  She does not want to use my chart, so she did not retrieve the message sent with last lab results.   Past Medical History:  Diagnosis Date  . Herniated disc     No past surgical history on file.  Social History   Social History  . Marital status: Married    Spouse name: N/A  . Number of children: N/A  . Years of education: N/A   Occupational History  . Not on file.   Social History Main Topics  . Smoking status: Current Every Day Smoker  . Smokeless tobacco: Never Used  . Alcohol use No  . Drug use: No  . Sexual activity: Not on file   Other Topics Concern  . Not on file   Social History Narrative  . No narrative on file    Current Outpatient Prescriptions on File Prior to Visit  Medication Sig Dispense Refill  . Acetaminophen-Caffeine (EXCEDRIN TENSION HEADACHE) 500-65 MG TABS Take 1 tablet by mouth daily as needed (pain).    Marland Kitchen albuterol (PROVENTIL HFA;VENTOLIN HFA) 108 (90 Base) MCG/ACT inhaler Inhale 2 puffs into the lungs every 6 (six) hours as needed for wheezing or shortness of breath. 1 Inhaler 2  . aspirin 81 MG EC tablet TAKE 1 TABLET BY MOUTH DAILY 30 tablet 0  . atorvastatin (LIPITOR) 40 MG tablet TAKE 1 TABLET BY MOUTH DAILY AT 6 PM 30 tablet 0  . atorvastatin (LIPITOR) 40 MG tablet TAKE 1 TABLET BY MOUTH DAILY AT 6 PM 30 tablet 0  . budesonide (PULMICORT) 0.25 MG/2ML nebulizer solution Take 2 mLs (0.25 mg total) by nebulization 2 (two) times daily. 60 mL 12  . cetirizine (ZYRTEC) 10 MG tablet Take 10 mg by  mouth daily.    . furosemide (LASIX) 20 MG tablet TAKE 1 TABLET BY MOUTH TWICE DAILY 30 tablet 0  . methimazole (TAPAZOLE) 10 MG tablet Take 1 tablet (10 mg total) by mouth 2 (two) times daily. 60 tablet 1  . potassium chloride (K-DUR) 10 MEQ tablet Take 1 tablet (10 mEq total) by mouth daily. 30 tablet 1  . ranitidine (ZANTAC) 150 MG tablet Take 150 mg by mouth daily.     No current facility-administered medications on file prior to visit.     Allergies  Allergen Reactions  . Other Rash    Per pt medication to treat MRSA - unsure of name     Family History  Problem Relation Age of Onset  . Thyroid disease Neg Hx     BP 120/64   Pulse 67   Wt 123 lb 9.6 oz (56.1 kg)   SpO2 95%   BMI 21.22 kg/m    Review of Systems Denies fever.     Objective:   Physical Exam VITAL SIGNS:  See vs page.   GENERAL: no distress.  NECK: thyroid is 10x normal size, (R>L), but no palpable nodule.      Assessment & Plan:  Hyperthyroidism: due for recheck.  Patient Instructions  blood tests are requested for you today.  We'll let you know about the results.  If ever you have fever while taking methimazole, stop it and call us, even if the reason is obvious, because of the risk of a rare side-effect.  Please come back for a follow-up appointment in 2 months.

## 2017-08-24 NOTE — Telephone Encounter (Signed)
Called patient but no VM set up to give her details on to keep taking methimazole 4 pills twice a day.

## 2017-09-26 ENCOUNTER — Other Ambulatory Visit: Payer: Self-pay | Admitting: Endocrinology

## 2017-10-19 ENCOUNTER — Ambulatory Visit: Payer: Managed Care, Other (non HMO) | Admitting: Endocrinology

## 2017-10-19 ENCOUNTER — Encounter: Payer: Self-pay | Admitting: Endocrinology

## 2017-10-19 VITALS — BP 118/68 | HR 67 | Wt 124.0 lb

## 2017-10-19 DIAGNOSIS — E0590 Thyrotoxicosis, unspecified without thyrotoxic crisis or storm: Secondary | ICD-10-CM

## 2017-10-19 LAB — TSH: TSH: 38.21 u[IU]/mL — ABNORMAL HIGH (ref 0.35–4.50)

## 2017-10-19 LAB — T4, FREE: Free T4: 0.04 ng/dL — ABNORMAL LOW (ref 0.60–1.60)

## 2017-10-19 MED ORDER — METHIMAZOLE 10 MG PO TABS: 10.0000 mg | ORAL_TABLET | Freq: Every day | ORAL | 2 refills | Status: DC

## 2017-10-19 NOTE — Patient Instructions (Signed)
blood tests are requested for you today.  We'll let you know about the results.   If ever you have fever while taking methimazole, stop it and call us, even if the reason is obvious, because of the risk of a rare side-effect.  Please come back for a follow-up appointment in 2 months.   

## 2017-10-19 NOTE — Progress Notes (Signed)
Subjective:    Patient ID: [name], female    DOB: 1961-08-24, 56 y.o.   MRN: [id number]  HPI Pt returns for f/u of hyperthyroidism (dx'ed in early 2018; she has never had thyroid imaging, but Grave's Dz is suggested by severity and phys exam; tapazole was chosen as initial rx, due to severity--she wishes to continue, as she cannot be separated from her grandson).  pt states she feels well in general.  She takes tapazole as rx'ed.  She declines mychart. we'll call.  Past Medical History:  Diagnosis Date  . Herniated disc     History reviewed. No pertinent surgical history.  Social History   Socioeconomic History  . Marital status: Married    Spouse name: Not on file  . Number of children: Not on file  . Years of education: Not on file  . Highest education level: Not on file  Social Needs  . Financial resource strain: Not on file  . Food insecurity - worry: Not on file  . Food insecurity - inability: Not on file  . Transportation needs - medical: Not on file  . Transportation needs - non-medical: Not on file  Occupational History  . Not on file  Tobacco Use  . Smoking status: Current Every Day Smoker  . Smokeless tobacco: Never Used  Substance and Sexual Activity  . Alcohol use: No  . Drug use: No  . Sexual activity: Not on file  Other Topics Concern  . Not on file  Social History Narrative  . Not on file    Current Outpatient Medications on File Prior to Visit  Medication Sig Dispense Refill  . Acetaminophen-Caffeine (EXCEDRIN TENSION HEADACHE) 500-65 MG TABS Take 1 tablet by mouth daily as needed (pain).    Marland Kitchen. albuterol (PROVENTIL HFA;VENTOLIN HFA) 108 (90 Base) MCG/ACT inhaler Inhale 2 puffs into the lungs every 6 (six) hours as needed for wheezing or shortness of breath. 1 Inhaler 2  . aspirin 81 MG EC tablet TAKE 1 TABLET BY MOUTH DAILY 30 tablet 0  . atorvastatin (LIPITOR) 40 MG tablet TAKE 1 TABLET BY MOUTH DAILY AT 6 PM 30 tablet 0  . budesonide  (PULMICORT) 0.25 MG/2ML nebulizer solution Take 2 mLs (0.25 mg total) by nebulization 2 (two) times daily. 60 mL 12  . cetirizine (ZYRTEC) 10 MG tablet Take 10 mg by mouth daily.    . furosemide (LASIX) 20 MG tablet TAKE 1 TABLET BY MOUTH TWICE DAILY 30 tablet 0  . potassium chloride (K-DUR) 10 MEQ tablet Take 1 tablet (10 mEq total) by mouth daily. 30 tablet 1  . ranitidine (ZANTAC) 150 MG tablet Take 150 mg by mouth daily.     No current facility-administered medications on file prior to visit.     Allergies  Allergen Reactions  . Other Rash    Per pt medication to treat MRSA - unsure of name     Family History  Problem Relation Age of Onset  . Thyroid disease Neg Hx     BP 118/68 (BP Location: Left Arm, Patient Position: Sitting, Cuff Size: Normal)   Pulse 67   Wt 124 lb (56.2 kg)   SpO2 96%   BMI 21.28 kg/m     Review of Systems Denies fever    Objective:   Physical Exam VITAL SIGNS:  See vs page.   GENERAL: no distress.  NECK: thyroid is 10x normal size, (R>L), but no palpable nodule.       Assessment & Plan:  Hyperthyroidism: due for recheck  Patient Instructions  blood tests are requested for you today.  We'll let you know about the results.  If ever you have fever while taking methimazole, stop it and call us, even if the reason is obvious, because of the risk of a rare side-effect.  Please come back for a follow-up appointment in 2 months.

## 2017-10-20 ENCOUNTER — Telehealth: Payer: Self-pay | Admitting: Endocrinology

## 2017-10-20 NOTE — Telephone Encounter (Signed)
Patient has VM box that hasn't been set up.

## 2017-10-20 NOTE — Telephone Encounter (Signed)
please call patient: Your thyroid has gone low. Please stop taking the methimazole. In 1 week, pease resume at 1 pill per day. I have sent a prescription to your pharmacy. I'll see you next time.

## 2017-10-25 NOTE — Telephone Encounter (Signed)
Tried to call patient again but received no answer.

## 2017-12-20 ENCOUNTER — Ambulatory Visit: Payer: Managed Care, Other (non HMO) | Admitting: Endocrinology

## 2017-12-20 ENCOUNTER — Encounter: Payer: Self-pay | Admitting: Endocrinology

## 2017-12-20 VITALS — BP 108/60 | HR 70 | Wt 132.4 lb

## 2017-12-20 DIAGNOSIS — E0590 Thyrotoxicosis, unspecified without thyrotoxic crisis or storm: Secondary | ICD-10-CM

## 2017-12-20 LAB — TSH: TSH: 44.53 u[IU]/mL — ABNORMAL HIGH (ref 0.35–4.50)

## 2017-12-20 LAB — T4, FREE: Free T4: 0 ng/dL — ABNORMAL LOW (ref 0.60–1.60)
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
## 2017-12-20 NOTE — Patient Instructions (Addendum)
blood tests are requested for you today.  We'll let you know about the results.  Based on the results, I'll probable say you should stop the methimazole and recheck the blood tests in 2 weeks.   If ever you have fever while taking methimazole, stop it and call us, even if the reason is obvious, because of the risk of a rare side-effect.  Please come back for a follow-up appointment in 2 months.

## 2018-01-03 ENCOUNTER — Other Ambulatory Visit: Payer: Managed Care, Other (non HMO)

## 2018-02-16 ENCOUNTER — Encounter: Payer: Self-pay | Admitting: Endocrinology

## 2018-02-16 ENCOUNTER — Ambulatory Visit: Payer: Managed Care, Other (non HMO) | Admitting: Endocrinology

## 2018-02-16 VITALS — BP 112/58 | HR 80 | Ht 64.0 in | Wt 117.0 lb

## 2018-02-16 DIAGNOSIS — E0590 Thyrotoxicosis, unspecified without thyrotoxic crisis or storm: Secondary | ICD-10-CM | POA: Diagnosis not present

## 2018-02-16 LAB — TSH: TSH: 0.02 u[IU]/mL — ABNORMAL LOW (ref 0.35–4.50)

## 2018-02-16 LAB — T4, FREE: Free T4: 2.17 ng/dL — ABNORMAL HIGH (ref 0.60–1.60)

## 2018-02-16 MED ORDER — METHIMAZOLE 10 MG PO TABS: 10.0000 mg | ORAL_TABLET | Freq: Every day | ORAL | 5 refills | Status: DC

## 2018-02-16 NOTE — Progress Notes (Signed)
Subjective:    Patient ID: [name], female    DOB: Jun 10, 1961, 58 y.o.   MRN: [id number]  HPI Pt returns for f/u of hyperthyroidism (dx'ed in early 2018; she has never had thyroid imaging, but Grave's Dz is suggested by severity and phys exam; tapazole was chosen as initial rx, due to severity--she wishes to continue, as she cannot be separated from her grandson).  pt states she feels well in general.  She has been off tapazole x 2 mos, due to elev TSH.  pt reports fatigue and hair loss.    Past Medical History:  Diagnosis Date  . Herniated disc     No past surgical history on file.  Social History   Socioeconomic History  . Marital status: Married    Spouse name: Not on file  . Number of children: Not on file  . Years of education: Not on file  . Highest education level: Not on file  Occupational History  . Not on file  Social Needs  . Financial resource strain: Not on file  . Food insecurity:    Worry: Not on file    Inability: Not on file  . Transportation needs:    Medical: Not on file    Non-medical: Not on file  Tobacco Use  . Smoking status: Current Every Day Smoker  . Smokeless tobacco: Never Used  Substance and Sexual Activity  . Alcohol use: No  . Drug use: No  . Sexual activity: Not on file  Lifestyle  . Physical activity:    Days per week: Not on file    Minutes per session: Not on file  . Stress: Not on file  Relationships  . Social connections:    Talks on phone: Not on file    Gets together: Not on file    Attends religious service: Not on file    Active member of club or organization: Not on file    Attends meetings of clubs or organizations: Not on file    Relationship status: Not on file  . Intimate partner violence:    Fear of current or ex partner: Not on file    Emotionally abused: Not on file    Physically abused: Not on file    Forced sexual activity: Not on file  Other Topics Concern  . Not on file  Social History Narrative  .  Not on file    Current Outpatient Medications on File Prior to Visit  Medication Sig Dispense Refill  . Acetaminophen-Caffeine (EXCEDRIN TENSION HEADACHE) 500-65 MG TABS Take 1 tablet by mouth daily as needed (pain).    Marland Kitchen albuterol (PROVENTIL HFA;VENTOLIN HFA) 108 (90 Base) MCG/ACT inhaler Inhale 2 puffs into the lungs every 6 (six) hours as needed for wheezing or shortness of breath. 1 Inhaler 2  . aspirin 81 MG EC tablet TAKE 1 TABLET BY MOUTH DAILY 30 tablet 0  . cetirizine (ZYRTEC) 10 MG tablet Take 10 mg by mouth daily.    . ranitidine (ZANTAC) 150 MG tablet Take 150 mg by mouth daily.    Marland Kitchen atorvastatin (LIPITOR) 40 MG tablet TAKE 1 TABLET BY MOUTH DAILY AT 6 PM (Patient not taking: Reported on 02/16/2018) 30 tablet 0  . budesonide (PULMICORT) 0.25 MG/2ML nebulizer solution Take 2 mLs (0.25 mg total) by nebulization 2 (two) times daily. (Patient not taking: Reported on 02/16/2018) 60 mL 12  . furosemide (LASIX) 20 MG tablet TAKE 1 TABLET BY MOUTH TWICE DAILY (Patient not taking: Reported  on 02/16/2018) 30 tablet 0  . potassium chloride (K-DUR) 10 MEQ tablet Take 1 tablet (10 mEq total) by mouth daily. (Patient not taking: Reported on 02/16/2018) 30 tablet 1   No current facility-administered medications on file prior to visit.     Allergies  Allergen Reactions  . Other Rash    Per pt medication to treat MRSA - unsure of name     Family History  Problem Relation Age of Onset  . Thyroid disease Neg Hx     BP (!) 112/58   Pulse 80   Ht 5\' 4"  (1.626 m)   Wt 117 lb (53.1 kg)   SpO2 96%   BMI 20.08 kg/m    Review of Systems Denies fever    Objective:   Physical Exam VITAL SIGNS:  See vs page.  GENERAL: no distress.  NECK: thyroid is 5-10x normal size, (R>L), with irreg surface, but no palpable nodule.    Lab Results  Component Value Date   TSH 0.02 (L) 02/16/2018   T3TOTAL 557 (H) 02/24/2017      Assessment & Plan:  hyperthyroidism, recurrent off tapazole.  I have  sent a prescription to your pharmacy, to resume at lower dosage.   Patient Instructions  blood tests are requested for you today.  We'll let you know about the results.  Based on the results, I'll probable say you should resume methimazole at just 1 pill per day If ever you have fever while taking methimazole, stop it and call us, even if the reason is obvious, because of the risk of a rare side-effect.  Please come back for a follow-up appointment in 2 months.

## 2018-02-16 NOTE — Patient Instructions (Addendum)
blood tests are requested for you today.  We'll let you know about the results.  Based on the results, I'll probable say you should resume methimazole at just 1 pill per day If ever you have fever while taking methimazole, stop it and call us, even if the reason is obvious, because of the risk of a rare side-effect.  Please come back for a follow-up appointment in 2 months.

## 2018-04-18 ENCOUNTER — Ambulatory Visit: Payer: Managed Care, Other (non HMO) | Admitting: Endocrinology

## 2018-04-18 ENCOUNTER — Encounter: Payer: Self-pay | Admitting: Endocrinology

## 2018-04-18 ENCOUNTER — Telehealth: Payer: Self-pay | Admitting: Endocrinology

## 2018-04-18 VITALS — BP 130/60 | HR 76 | Wt 117.0 lb

## 2018-04-18 DIAGNOSIS — E0590 Thyrotoxicosis, unspecified without thyrotoxic crisis or storm: Secondary | ICD-10-CM

## 2018-04-18 LAB — T4, FREE: Free T4: 2.56 ng/dL — ABNORMAL HIGH (ref 0.60–1.60)

## 2018-04-18 LAB — TSH: TSH: 0.01 u[IU]/mL — ABNORMAL LOW (ref 0.35–4.50)

## 2018-04-18 MED ORDER — METHIMAZOLE 10 MG PO TABS: 10.0000 mg | ORAL_TABLET | Freq: Two times a day (BID) | ORAL | 5 refills | Status: DC

## 2018-04-18 NOTE — Patient Instructions (Addendum)
blood tests are requested for you today.  We'll let you know about the results.  Based on the results, I'll probable say you should continue methimazole, 1 pill per day If ever you have fever while taking methimazole, stop it and call us, even if the reason is obvious, because of the risk of a rare side-effect.  Please come back for a follow-up appointment in 3 months.

## 2018-04-18 NOTE — Telephone Encounter (Signed)
please call patient: Thyroid is still high. Please increase the methimazole to twice a day, at least for now. Please come to the lab, to recheck the blood tests in 1 month. When the blood tests are better, we may be able to reduce back to 1 pill per day

## 2018-04-18 NOTE — Progress Notes (Signed)
Subjective:    Patient ID: [name], female    DOB: 1961/11/19, 57 y.o.   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  pt states she feels well in general.  She resumed tapazole 2 weeks ago.  She does not use mychart.   Past Medical History:  Diagnosis Date  . Herniated disc     History reviewed. No pertinent surgical history.  Social History   Socioeconomic History  . Marital status: Married    Spouse name: Not on file  . Number of children: Not on file  . Years of education: Not on file  . Highest education level: Not on file  Occupational History  . Not on file  Social Needs  . Financial resource strain: Not on file  . Food insecurity:    Worry: Not on file    Inability: Not on file  . Transportation needs:    Medical: Not on file    Non-medical: Not on file  Tobacco Use  . Smoking status: Current Every Day Smoker  . Smokeless tobacco: Never Used  Substance and Sexual Activity  . Alcohol use: No  . Drug use: No  . Sexual activity: Not on file  Lifestyle  . Physical activity:    Days per week: Not on file    Minutes per session: Not on file  . Stress: Not on file  Relationships  . Social connections:    Talks on phone: Not on file    Gets together: Not on file    Attends religious service: Not on file    Active member of club or organization: Not on file    Attends meetings of clubs or organizations: Not on file    Relationship status: Not on file  . Intimate partner violence:    Fear of current or ex partner: Not on file    Emotionally abused: Not on file    Physically abused: Not on file    Forced sexual activity: Not on file  Other Topics Concern  . Not on file  Social History Narrative  . Not on file      Current Outpatient Medications on File Prior to Visit  Medication Sig Dispense Refill  . Acetaminophen-Caffeine (EXCEDRIN TENSION HEADACHE) 500-65 MG TABS Take 1 tablet by mouth daily as needed (pain).    Marland Kitchen albuterol (PROVENTIL HFA;VENTOLIN HFA) 108 (90 Base) MCG/ACT inhaler Inhale 2 puffs into the lungs every 6 (six) hours as needed for wheezing or shortness of breath. 1 Inhaler 2  . aspirin 81 MG EC tablet TAKE 1 TABLET BY MOUTH DAILY 30 tablet 0  . cetirizine (ZYRTEC) 10 MG tablet Take 10 mg by mouth daily.    . ranitidine (ZANTAC) 150 MG tablet Take 150 mg by mouth daily.    Marland Kitchen atorvastatin (LIPITOR) 40 MG tablet TAKE 1 TABLET BY MOUTH DAILY AT 6 PM (Patient not taking: Reported on 02/16/2018) 30 tablet 0  . budesonide (PULMICORT) 0.25 MG/2ML nebulizer solution Take 2 mLs (0.25 mg total) by nebulization 2 (two) times daily. (Patient not taking: Reported on 02/16/2018) 60 mL 12  . furosemide (LASIX) 20 MG tablet TAKE 1 TABLET BY MOUTH TWICE DAILY (Patient not taking: Reported on 02/16/2018) 30 tablet 0  .  potassium chloride (K-DUR) 10 MEQ tablet Take 1 tablet (10 mEq total) by mouth daily. (Patient not taking: Reported on 02/16/2018) 30 tablet 1   No current facility-administered medications on file prior to visit.     Allergies  Allergen Reactions  . Other Rash    Per pt medication to treat MRSA - unsure of name     Family History  Problem Relation Age of Onset  . Thyroid disease Neg Hx     BP 130/60   Pulse 76   Wt 117 lb (53.1 kg)   SpO2 96%   BMI 20.08 kg/m   Review of Systems Denies fever    Objective:   Physical Exam VITAL SIGNS:  See vs page.  GENERAL: no distress.  NECK: thyroid is 10x normal size, (R>L), with no palpable nodule.   Lab Results  Component Value Date   TSH 0.01 (L) 04/18/2018   T3TOTAL 557 (H) 02/24/2017      Assessment & Plan:  Hyperthyroidism: she needs increased rx, at least temporarily.    Patient Instructions  blood tests are  requested for you today.  We'll let you know about the results.  Based on the results, I'll probable say you should continue methimazole, 1 pill per day If ever you have fever while taking methimazole, stop it and call us, even if the reason is obvious, because of the risk of a rare side-effect.  Please come back for a follow-up appointment in 3 months.

## 2018-04-19 NOTE — Telephone Encounter (Signed)
I tried to call patient, but was unable to leave a message bc no VM box was set up.

## 2018-04-21 NOTE — Telephone Encounter (Signed)
Once again tried to call patient to set up appointment for labs. No VM set up.

## 2018-07-19 ENCOUNTER — Ambulatory Visit: Payer: Managed Care, Other (non HMO) | Admitting: Endocrinology

## 2018-07-19 ENCOUNTER — Encounter: Payer: Self-pay | Admitting: Endocrinology

## 2018-07-19 VITALS — BP 134/60 | HR 80 | Temp 98.3°F | Ht 64.0 in | Wt 120.0 lb

## 2018-07-19 DIAGNOSIS — E0590 Thyrotoxicosis, unspecified without thyrotoxic crisis or storm: Secondary | ICD-10-CM | POA: Diagnosis not present

## 2018-07-19 LAB — T4, FREE: Free T4: 2.25 ng/dL — ABNORMAL HIGH (ref 0.60–1.60)

## 2018-07-19 LAB — TSH: TSH: <0.01 u[IU]/mL — ABNORMAL LOW (ref 0.35–4.50)

## 2018-07-19 MED ORDER — METHIMAZOLE 10 MG PO TABS: 10.0000 mg | ORAL_TABLET | Freq: Two times a day (BID) | ORAL | 5 refills | Status: DC

## 2018-07-19 NOTE — Progress Notes (Signed)
Subjective:    Patient ID: [name], female    DOB: Jul 27, 1961, 57 y.o.   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  pt states she feels better in general.  She takes tapazole 10 mg qd.  She does not use mychart.  Past Medical History:  Diagnosis Date  . Herniated disc     No past surgical history on file.  Social History   Socioeconomic History  . Marital status: Married    Spouse name: Not on file  . Number of children: Not on file  . Years of education: Not on file  . Highest education level: Not on file  Occupational History  . Not on file  Social Needs  . Financial resource strain: Not on file  . Food insecurity:    Worry: Not on file    Inability: Not on file  . Transportation needs:    Medical: Not on file    Non-medical: Not on file  Tobacco Use  . Smoking status: Current Every Day Smoker  . Smokeless tobacco: Never Used  Substance and Sexual Activity  . Alcohol use: No  . Drug use: No  . Sexual activity: Not on file  Lifestyle  . Physical activity:    Days per week: Not on file    Minutes per session: Not on file  . Stress: Not on file  Relationships  . Social connections:    Talks on phone: Not on file    Gets together: Not on file    Attends religious service: Not on file    Active member of club or organization: Not on file    Attends meetings of clubs or organizations: Not on file    Relationship status: Not on file  . Intimate partner violence:    Fear of current or ex partner: Not on file    Emotionally abused: Not on file    Physically abused: Not on file    Forced sexual activity: Not on file  Other Topics Concern  . Not on file  Social History Narrative  . Not on file    Current  Outpatient Medications on File Prior to Visit  Medication Sig Dispense Refill  . Acetaminophen-Caffeine (EXCEDRIN TENSION HEADACHE) 500-65 MG TABS Take 1 tablet by mouth daily as needed (pain).    Marland Kitchen. albuterol (PROVENTIL HFA;VENTOLIN HFA) 108 (90 Base) MCG/ACT inhaler Inhale 2 puffs into the lungs every 6 (six) hours as needed for wheezing or shortness of breath. 1 Inhaler 2  . furosemide (LASIX) 20 MG tablet TAKE 1 TABLET BY MOUTH TWICE DAILY 30 tablet 0  . ranitidine (ZANTAC) 150 MG tablet Take 150 mg by mouth daily.    Marland Kitchen. aspirin 81 MG EC tablet TAKE 1 TABLET BY MOUTH DAILY (Patient not taking: Reported on 07/19/2018) 30 tablet 0  . atorvastatin (LIPITOR) 40 MG tablet TAKE 1 TABLET BY MOUTH DAILY AT 6 PM (Patient not taking: Reported on 07/19/2018) 30 tablet 0  . budesonide (PULMICORT) 0.25 MG/2ML nebulizer solution Take 2 mLs (0.25 mg total) by nebulization 2 (two) times daily. (Patient not taking: Reported on 07/19/2018) 60 mL 12  . cetirizine (ZYRTEC) 10 MG tablet Take 10 mg by mouth daily.    . potassium chloride (  K-DUR) 10 MEQ tablet Take 1 tablet (10 mEq total) by mouth daily. (Patient not taking: Reported on 07/19/2018) 30 tablet 1   No current facility-administered medications on file prior to visit.     Allergies  Allergen Reactions  . Other Rash    Per pt medication to treat MRSA - unsure of name     Family History  Problem Relation Age of Onset  . Thyroid disease Neg Hx     BP 134/60 (BP Location: Right Arm, Patient Position: Sitting, Cuff Size: Normal)   Pulse 80   Temp 98.3 F (36.8 C) (Oral)   Ht 5\' 4"  (1.626 m)   Wt 120 lb (54.4 kg)   SpO2 95%   BMI 20.60 kg/m    Review of Systems Denies fever    Objective:   Physical Exam VITAL SIGNS:  See vs page.  GENERAL: no distress.  NECK: thyroid is 10x normal size, (R slightly >L), with no palpable nodule.    Lab Results  Component Value Date   TSH <0.01 (L) 07/19/2018   T3TOTAL 557 (H) 02/24/2017        Assessment & Plan:  Hyperthyroidism: he needs increased rx.  increase the methimazole to 1 pill, twice a day Please come back for a follow-up appointment in 3 months

## 2018-07-19 NOTE — Patient Instructions (Addendum)
blood tests are requested for you today.  We'll let you know about the results. If ever you have fever while taking methimazole, stop it and call us, even if the reason is obvious, because of the risk of a rare side-effect.  Please come back for a follow-up appointment in 3 months.   

## 2018-08-31 ENCOUNTER — Emergency Department (HOSPITAL_COMMUNITY): Payer: Managed Care, Other (non HMO)

## 2018-08-31 ENCOUNTER — Other Ambulatory Visit: Payer: Self-pay

## 2018-08-31 ENCOUNTER — Emergency Department (HOSPITAL_COMMUNITY)
Admission: EM | Admit: 2018-08-31 | Discharge: 2018-08-31 | Disposition: A | Discharge status: Home / Self Care | Payer: Managed Care, Other (non HMO) | Attending: Emergency Medicine | Admitting: Emergency Medicine

## 2018-08-31 DIAGNOSIS — F17210 Nicotine dependence, cigarettes, uncomplicated: Secondary | ICD-10-CM | POA: Insufficient documentation

## 2018-08-31 DIAGNOSIS — T148XXA Other injury of unspecified body region, initial encounter: Secondary | ICD-10-CM

## 2018-08-31 DIAGNOSIS — Y9384 Activity, sleeping: Secondary | ICD-10-CM | POA: Insufficient documentation

## 2018-08-31 DIAGNOSIS — W503XXA Accidental bite by another person, initial encounter: Secondary | ICD-10-CM

## 2018-08-31 DIAGNOSIS — M25561 Pain in right knee: Secondary | ICD-10-CM | POA: Insufficient documentation

## 2018-08-31 DIAGNOSIS — Y999 Unspecified external cause status: Secondary | ICD-10-CM | POA: Insufficient documentation

## 2018-08-31 DIAGNOSIS — S61452A Open bite of left hand, initial encounter: Secondary | ICD-10-CM | POA: Insufficient documentation

## 2018-08-31 DIAGNOSIS — Z79899 Other long term (current) drug therapy: Secondary | ICD-10-CM | POA: Diagnosis not present

## 2018-08-31 DIAGNOSIS — Z7982 Long term (current) use of aspirin: Secondary | ICD-10-CM | POA: Diagnosis not present

## 2018-08-31 DIAGNOSIS — Y92013 Bedroom of single-family (private) house as the place of occurrence of the external cause: Secondary | ICD-10-CM | POA: Diagnosis not present

## 2018-08-31 DIAGNOSIS — L089 Local infection of the skin and subcutaneous tissue, unspecified: Secondary | ICD-10-CM

## 2018-08-31 DIAGNOSIS — S81852A Open bite, left lower leg, initial encounter: Secondary | ICD-10-CM | POA: Diagnosis not present

## 2018-08-31 MED ORDER — AMOXICILLIN-POT CLAVULANATE 875-125 MG PO TABS
1.0000 | ORAL_TABLET | Freq: Once | ORAL | Status: AC
  Administered 2018-08-31: 1 via ORAL
  Filled 2018-08-31: qty 1

## 2018-08-31 MED ORDER — AMOXICILLIN-POT CLAVULANATE 875-125 MG PO TABS: 1.0000 | ORAL_TABLET | Freq: Two times a day (BID) | ORAL | 0 refills | Status: AC

## 2018-08-31 MED ORDER — HYDROCODONE-ACETAMINOPHEN 5-325 MG PO TABS: 1.0000 | ORAL_TABLET | ORAL | 0 refills | Status: DC | PRN
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
## 2018-08-31 NOTE — ED Notes (Signed)
Patient verbalized understanding of discharge instructions, no questions. Patient out of ED via wheelchair in no distress.  

## 2018-08-31 NOTE — Discharge Instructions (Addendum)
Follow-up immediately if you notice worsening redness or swelling over your hand or knee.  Please also return if you have a fever or any new or concerning symptoms.  Take antibiotic twice a day until completely gone.

## 2018-10-20 ENCOUNTER — Ambulatory Visit: Payer: Managed Care, Other (non HMO) | Admitting: Endocrinology

## 2018-10-20 ENCOUNTER — Encounter: Payer: Self-pay | Admitting: Endocrinology

## 2018-10-20 VITALS — BP 132/68 | HR 67 | Ht 64.0 in | Wt 123.8 lb

## 2018-10-20 DIAGNOSIS — E0590 Thyrotoxicosis, unspecified without thyrotoxic crisis or storm: Secondary | ICD-10-CM | POA: Diagnosis not present

## 2018-10-20 LAB — TSH: TSH: <0.01 u[IU]/mL — ABNORMAL LOW (ref 0.35–4.50)

## 2018-10-20 LAB — T4, FREE: Free T4: 0.76 ng/dL (ref 0.60–1.60)

## 2018-10-20 NOTE — Patient Instructions (Signed)
blood tests are requested for you today.  We'll let you know about the results. If ever you have fever while taking methimazole, stop it and call us, even if the reason is obvious, because of the risk of a rare side-effect.  Please come back for a follow-up appointment in 3 months.   

## 2018-10-20 NOTE — Progress Notes (Signed)
Subjective:    Patient ID: [name], female    DOB: 1961-10-31, 57 y.o.   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  pt states she feels well in general, except for weight gain.  She takes tapazole 10 mg BID.  She does not use mychart.  Past Medical History:  Diagnosis Date  . Herniated disc     No past surgical history on file.  Social History   Socioeconomic History  . Marital status: Married    Spouse name: Not on file  . Number of children: Not on file  . Years of education: Not on file  . Highest education level: Not on file  Occupational History  . Not on file  Social Needs  . Financial resource strain: Not on file  . Food insecurity:    Worry: Not on file    Inability: Not on file  . Transportation needs:    Medical: Not on file    Non-medical: Not on file  Tobacco Use  . Smoking status: Current Every Day Smoker  . Smokeless tobacco: Never Used  Substance and Sexual Activity  . Alcohol use: No  . Drug use: No  . Sexual activity: Not on file  Lifestyle  . Physical activity:    Days per week: Not on file    Minutes per session: Not on file  . Stress: Not on file  Relationships  . Social connections:    Talks on phone: Not on file    Gets together: Not on file    Attends religious service: Not on file    Active member of club or organization: Not on file    Attends meetings of clubs or organizations: Not on file    Relationship status: Not on file  . Intimate partner violence:    Fear of current or ex partner: Not on file    Emotionally abused: Not on file    Physically abused: Not on file    Forced sexual activity: Not on file  Other Topics Concern  . Not on file  Social History Narrative  . Not on  file    Current Outpatient Medications on File Prior to Visit  Medication Sig Dispense Refill  . Acetaminophen-Caffeine (EXCEDRIN TENSION HEADACHE) 500-65 MG TABS Take 2 tablets by mouth daily as needed (pain).     Marland Kitchen albuterol (PROVENTIL HFA;VENTOLIN HFA) 108 (90 Base) MCG/ACT inhaler Inhale 2 puffs into the lungs every 6 (six) hours as needed for wheezing or shortness of breath. 1 Inhaler 2  . cetirizine (ZYRTEC) 10 MG tablet Take 10 mg by mouth daily.    . methimazole (TAPAZOLE) 10 MG tablet Take 1 tablet (10 mg total) by mouth 2 (two) times daily. 60 tablet 5   No current facility-administered medications on file prior to visit.     Allergies  Allergen Reactions  . Other Rash    Per pt medication to treat MRSA - unsure of name     Family History  Problem Relation Age of Onset  . Thyroid disease Neg Hx     BP 132/68 (BP Location: Right Arm, Patient Position: Sitting, Cuff Size: Normal)   Pulse 67   Ht 5\' 4"  (1.626 m)   Wt 123  lb 12.8 oz (56.2 kg)   SpO2 98%   BMI 21.25 kg/m    Review of Systems Denies fever.      Objective:   Physical Exam VITAL SIGNS:  See vs page.  GENERAL: no distress.  NECK: thyroid is approx 10x normal size, (R >L), with no palpable nodule.     Lab Results  Component Value Date   TSH <0.01 (L) 10/20/2018   T3TOTAL 557 (H) 02/24/2017      Assessment & Plan:  Hyperthyroidism, improved.  Please continue the same medication. CHF: in this context, she needs to maintain euthyroidism Please come back for a follow-up appointment in 2 months.
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
## 2018-12-20 ENCOUNTER — Encounter: Payer: Self-pay | Admitting: Endocrinology

## 2018-12-20 ENCOUNTER — Ambulatory Visit: Payer: Managed Care, Other (non HMO) | Admitting: Endocrinology

## 2018-12-20 VITALS — BP 128/50 | HR 73 | Ht 64.0 in | Wt 128.4 lb

## 2018-12-20 DIAGNOSIS — E0590 Thyrotoxicosis, unspecified without thyrotoxic crisis or storm: Secondary | ICD-10-CM

## 2018-12-20 LAB — TSH: TSH: <0.01 u[IU]/mL — ABNORMAL LOW (ref 0.35–4.50)

## 2018-12-20 LAB — T4, FREE: Free T4: 0.5 ng/dL — ABNORMAL LOW (ref 0.60–1.60)

## 2018-12-20 NOTE — Patient Instructions (Addendum)
blood tests are requested for you today.  We'll let you know about the results.  If ever you have fever while taking methimazole, stop it and call us, even if the reason is obvious, because of the risk of a rare side-effect.   It is best to never miss a dose.  However, if you do miss, next best is to double up the next time.   Please come back for a follow-up appointment in 3 months.

## 2018-12-20 NOTE — Progress Notes (Signed)
Subjective:    Patient ID: [name], female    DOB: February 13, 1961, 58 y.o.   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  pt states she feels well in general.  She takes tapazole as rx'ed.   Past Medical History:  Diagnosis Date  . Herniated disc     No past surgical history on file.  Social History   Socioeconomic History  . Marital status: Married    Spouse name: Not on file  . Number of children: Not on file  . Years of education: Not on file  . Highest education level: Not on file  Occupational History  . Not on file  Social Needs  . Financial resource strain: Not on file  . Food insecurity:    Worry: Not on file    Inability: Not on file  . Transportation needs:    Medical: Not on file    Non-medical: Not on file  Tobacco Use  . Smoking status: Current Every Day Smoker  . Smokeless tobacco: Never Used  Substance and Sexual Activity  . Alcohol use: No  . Drug use: No  . Sexual activity: Not on file  Lifestyle  . Physical activity:    Days per week: Not on file    Minutes per session: Not on file  . Stress: Not on file  Relationships  . Social connections:    Talks on phone: Not on file    Gets together: Not on file    Attends religious service: Not on file    Active member of club or organization: Not on file    Attends meetings of clubs or organizations: Not on file    Relationship status: Not on file  . Intimate partner violence:    Fear of current or ex partner: Not on file    Emotionally abused: Not on file    Physically abused: Not on file    Forced sexual activity: Not on file  Other Topics Concern  . Not on file  Social History Narrative  . Not on file    Current Outpatient Medications on File  Prior to Visit  Medication Sig Dispense Refill  . Acetaminophen-Caffeine (EXCEDRIN TENSION HEADACHE) 500-65 MG TABS Take 2 tablets by mouth daily as needed (pain).     . cetirizine (ZYRTEC) 10 MG tablet Take 10 mg by mouth daily.    . methimazole (TAPAZOLE) 10 MG tablet Take 1 tablet (10 mg total) by mouth 2 (two) times daily. 60 tablet 5   No current facility-administered medications on file prior to visit.     Allergies  Allergen Reactions  . Other Rash    Per pt medication to treat MRSA - unsure of name     Family History  Problem Relation Age of Onset  . Thyroid disease Neg Hx     BP (!) 128/50 (BP Location: Right Arm, Patient Position: Sitting, Cuff Size: Normal)   Pulse 73   Ht 5\' 4"  (1.626 m)   Wt 128 lb 6.4 oz (58.2 kg)   SpO2 96%   BMI 22.04 kg/m    Review of Systems Denies fever.      Objective:   Physical Exam VITAL SIGNS:  See vs page.  GENERAL: no  distress.  NECK: thyroid is approx 10x normal size, (R >L); there is no palpable nodule.    Lab Results  Component Value Date   TSH <0.01 (L) 12/20/2018   T3TOTAL 557 (H) 02/24/2017       Assessment & Plan:  Hyperthyroidism: improved, but not normalized yet.   Large goiter: in this context, she needs high-dosage tapazole  Patient Instructions  blood tests are requested for you today.  We'll let you know about the results.  If ever you have fever while taking methimazole, stop it and call us, even if the reason is obvious, because of the risk of a rare side-effect.   It is best to never miss a dose.  However, if you do miss, next best is to double up the next time.   Please come back for a follow-up appointment in 3 months.

## 2019-01-23 ENCOUNTER — Ambulatory Visit: Payer: Managed Care, Other (non HMO) | Admitting: Endocrinology

## 2019-03-20 ENCOUNTER — Encounter: Payer: Self-pay | Admitting: Endocrinology

## 2019-03-21 ENCOUNTER — Ambulatory Visit: Payer: Managed Care, Other (non HMO) | Admitting: Endocrinology

## 2019-03-21 ENCOUNTER — Other Ambulatory Visit: Payer: Self-pay

## 2019-03-21 DIAGNOSIS — E0590 Thyrotoxicosis, unspecified without thyrotoxic crisis or storm: Secondary | ICD-10-CM

## 2019-03-21 NOTE — Progress Notes (Signed)
Subjective:    Patient ID: [name], female    DOB: [birth year], 58 y.o.   MRN: [id number]  HPI  telehealth visit today via doxy video visit.  Alternatives to telehealth are presented to this patient, and the patient agrees to the telehealth visit. Pt is advised of the cost of the visit, and agrees to this, also.   Patient is at home, and I am at the office.   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  pt states she feels well in general, except for weight gain. She takes tapazole 10-BID, as rx'ed.  No change in chronic neck swelling.   Past Medical History:  Diagnosis Date  . Herniated disc     No past surgical history on file.  Social History   Socioeconomic History  . Marital status: Married    Spouse name: Not on file  . Number of children: Not on file  . Years of education: Not on file  . Highest education level: Not on file  Occupational History  . Not on file  Social Needs  . Financial resource strain: Not on file  . Food insecurity:    Worry: Not on file    Inability: Not on file  . Transportation needs:    Medical: Not on file    Non-medical: Not on file  Tobacco Use  . Smoking status: Current Every Day Smoker  . Smokeless tobacco: Never Used  Substance and Sexual Activity  . Alcohol use: No  . Drug use: No  . Sexual activity: Not on file  Lifestyle  . Physical activity:    Days per week: Not on file    Minutes per session: Not on file  . Stress: Not on file  Relationships  . Social connections:    Talks on phone: Not on file    Gets together: Not on file    Attends religious service: Not on file    Active member of club or organization: Not on file    Attends meetings of clubs or organizations: Not on file    Relationship  status: Not on file  . Intimate partner violence:    Fear of current or ex partner: Not on file    Emotionally abused: Not on file    Physically abused: Not on file    Forced sexual activity: Not on file  Other Topics Concern  . Not on file  Social History Narrative  . Not on file    Current Outpatient Medications on File Prior to Visit  Medication Sig Dispense Refill  . Acetaminophen-Caffeine (EXCEDRIN TENSION HEADACHE) 500-65 MG TABS Take 2 tablets by mouth daily as needed (pain).     . cetirizine (ZYRTEC) 10 MG tablet Take 10 mg by mouth daily.    . methimazole (TAPAZOLE) 10 MG tablet Take 1 tablet (10 mg total) by mouth 2 (two) times daily. 60 tablet 5   No current facility-administered medications on file prior to visit.     Allergies  Allergen Reactions  . Other Rash    Per pt medication to treat MRSA - unsure of name     Family History  Problem Relation Age of Onset  . Thyroid disease Neg Hx     Review of Systems Denies fever.  Objective:   Physical Exam   Lab Results  Component Value Date   TSH 3.30 03/22/2019   T3TOTAL 557 (H) 02/24/2017      Assessment & Plan:  Hyperthyroidism: well-controlled. 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    Patient Instructions  blood tests are requested for you today.  We'll let you know about the results.  If ever you have fever while taking methimazole, stop it and call us, even if the reason is obvious, because of the risk of a rare side-effect.   It is best to never miss a dose.  However, if you do miss, next best is to double up the next time.   Please come back for a follow-up appointment in 3 months.

## 2019-03-21 NOTE — Patient Instructions (Signed)
blood tests are requested for you today.  We'll let you know about the results.  If ever you have fever while taking methimazole, stop it and call us, even if the reason is obvious, because of the risk of a rare side-effect.   It is best to never miss a dose.  However, if you do miss, next best is to double up the next time.   Please come back for a follow-up appointment in 3 months.

## 2019-03-22 ENCOUNTER — Other Ambulatory Visit (INDEPENDENT_AMBULATORY_CARE_PROVIDER_SITE_OTHER): Payer: Managed Care, Other (non HMO)

## 2019-03-22 ENCOUNTER — Other Ambulatory Visit: Payer: Self-pay

## 2019-03-22 DIAGNOSIS — E0590 Thyrotoxicosis, unspecified without thyrotoxic crisis or storm: Secondary | ICD-10-CM

## 2019-03-22 LAB — TSH: TSH: 3.3 u[IU]/mL (ref 0.35–4.50)

## 2019-03-22 LAB — T4, FREE: Free T4: 0.36 ng/dL — ABNORMAL LOW (ref 0.60–1.60)

## 2019-04-23 ENCOUNTER — Other Ambulatory Visit: Payer: Self-pay | Admitting: Endocrinology

## 2019-06-16 ENCOUNTER — Other Ambulatory Visit: Payer: Self-pay

## 2019-06-20 ENCOUNTER — Other Ambulatory Visit: Payer: Self-pay

## 2019-06-20 ENCOUNTER — Ambulatory Visit: Payer: Managed Care, Other (non HMO) | Admitting: Endocrinology

## 2019-06-20 ENCOUNTER — Encounter: Payer: Self-pay | Admitting: Endocrinology

## 2019-06-20 VITALS — BP 126/60 | HR 64 | Ht 64.0 in | Wt 141.8 lb

## 2019-06-20 DIAGNOSIS — E0590 Thyrotoxicosis, unspecified without thyrotoxic crisis or storm: Secondary | ICD-10-CM

## 2019-06-20 LAB — T4, FREE: Free T4: 0.49 ng/dL — ABNORMAL LOW (ref 0.60–1.60)

## 2019-06-20 LAB — TSH: TSH: 0.86 u[IU]/mL (ref 0.35–4.50)

## 2019-06-20 NOTE — Progress Notes (Signed)
Subjective:    Patient ID: [name], female    DOB: 07-07-1961, 58 y.o.   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  pt states she feels well in general. She takes tapazole 10-BID, as rx'ed.  No change in chronic neck swelling.  Past Medical History:  Diagnosis Date  . Herniated disc     No past surgical history on file.  Social History   Socioeconomic History  . Marital status: Married    Spouse name: Not on file  . Number of children: Not on file  . Years of education: Not on file  . Highest education level: Not on file  Occupational History  . Not on file  Social Needs  . Financial resource strain: Not on file  . Food insecurity    Worry: Not on file    Inability: Not on file  . Transportation needs    Medical: Not on file    Non-medical: Not on file  Tobacco Use  . Smoking status: Current Every Day Smoker  . Smokeless tobacco: Never Used  Substance and Sexual Activity  . Alcohol use: No  . Drug use: No  . Sexual activity: Not on file  Lifestyle  . Physical activity    Days per week: Not on file    Minutes per session: Not on file  . Stress: Not on file  Relationships  . Social Musicianconnections    Talks on phone: Not on file    Gets together: Not on file    Attends religious service: Not on file    Active member of club or organization: Not on file    Attends meetings of clubs or organizations: Not on file    Relationship status: Not on file  . Intimate partner violence    Fear of current or ex partner: Not on file    Emotionally abused: Not on file    Physically abused: Not on file    Forced sexual activity: Not on file  Other Topics Concern  . Not on file  Social History Narrative  . Not on file     Current Outpatient Medications on File Prior to Visit  Medication Sig Dispense Refill  . Acetaminophen-Caffeine (EXCEDRIN TENSION HEADACHE) 500-65 MG TABS Take 2 tablets by mouth daily as needed (pain).     . cetirizine (ZYRTEC) 10 MG tablet Take 10 mg by mouth daily.    . methimazole (TAPAZOLE) 10 MG tablet TAKE 1 TABLET BY MOUTH TWICE DAILY 60 tablet 5   No current facility-administered medications on file prior to visit.     Allergies  Allergen Reactions  . Other Rash    Per pt medication to treat MRSA - unsure of name     Family History  Problem Relation Age of Onset  . Thyroid disease Neg Hx     BP 126/60 (BP Location: Left Arm, Patient Position: Sitting, Cuff Size: Normal)   Pulse 64   Ht 5\' 4"  (1.626 m)   Wt 141 lb 12.8 oz (64.3 kg)   SpO2 97%   BMI 24.34 kg/m  Review of Systems Denies fever    Objective:   Physical Exam VITAL SIGNS:  See vs page GENERAL: no distress NECK: Thyroid is 10  times normal size, diffuse.  No thyroid nodule is palpable.  No palpable lymphadenopathy at the anterior neck.  Lab Results  Component Value Date   TSH 0.86 06/20/2019   T3TOTAL 557 (H) 02/24/2017      Assessment & Plan:  Hyperthyroidism: well-controlled Large goiter: stable: this is the likely reason why pt needs so much tapazole  Patient Instructions  blood tests are requested for you today.  We'll let you know about the results.  If ever you have fever while taking methimazole, stop it and call us, even if the reason is obvious, because of the risk of a rare side-effect.   It is best to never miss a dose.  However, if you do miss, next best is to double up the next time.   Please come back for a follow-up appointment in 4 months.

## 2019-06-20 NOTE — Patient Instructions (Signed)
blood tests are requested for you today.  We'll let you know about the results.  If ever you have fever while taking methimazole, stop it and call us, even if the reason is obvious, because of the risk of a rare side-effect.   It is best to never miss a dose.  However, if you do miss, next best is to double up the next time.   Please come back for a follow-up appointment in 4 months.

## 2019-10-13 ENCOUNTER — Other Ambulatory Visit: Payer: Self-pay

## 2019-10-17 ENCOUNTER — Other Ambulatory Visit: Payer: Self-pay

## 2019-10-17 ENCOUNTER — Ambulatory Visit: Payer: Managed Care, Other (non HMO) | Admitting: Endocrinology

## 2019-10-17 ENCOUNTER — Encounter: Payer: Self-pay | Admitting: Endocrinology

## 2019-10-17 VITALS — BP 124/72 | HR 64 | Ht 64.0 in | Wt 145.6 lb

## 2019-10-17 DIAGNOSIS — E0590 Thyrotoxicosis, unspecified without thyrotoxic crisis or storm: Secondary | ICD-10-CM

## 2019-10-17 LAB — TSH: TSH: 4.6 u[IU]/mL — ABNORMAL HIGH (ref 0.35–4.50)

## 2019-10-17 LAB — T4, FREE: Free T4: 0.47 ng/dL — ABNORMAL LOW (ref 0.60–1.60)

## 2019-10-17 NOTE — Patient Instructions (Addendum)
blood tests are requested for you today.  We'll let you know about the results.  If ever you have fever while taking methimazole, stop it and call us, even if the reason is obvious, because of the risk of a rare side-effect.   It is best to never miss a dose.  However, if you do miss, next best is to double up the next time.   Please come back for a follow-up appointment in 4-6 months.

## 2019-10-17 NOTE — Progress Notes (Signed)
Subjective:    Patient ID: [name], female    DOB: [birth year], 58 y.o.   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  pt states she feels well in general. She takes tapazole as rx'ed.  chronic neck swelling is unchanged.   Past Medical History:  Diagnosis Date  . Herniated disc     No past surgical history on file.  Social History   Socioeconomic History  . Marital status: Married    Spouse name: Not on file  . Number of children: Not on file  . Years of education: Not on file  . Highest education level: Not on file  Occupational History  . Not on file  Social Needs  . Financial resource strain: Not on file  . Food insecurity    Worry: Not on file    Inability: Not on file  . Transportation needs    Medical: Not on file    Non-medical: Not on file  Tobacco Use  . Smoking status: Current Every Day Smoker  . Smokeless tobacco: Never Used  Substance and Sexual Activity  . Alcohol use: No  . Drug use: No  . Sexual activity: Not on file  Lifestyle  . Physical activity    Days per week: Not on file    Minutes per session: Not on file  . Stress: Not on file  Relationships  . Social Musician on phone: Not on file    Gets together: Not on file    Attends religious service: Not on file    Active member of club or organization: Not on file    Attends meetings of clubs or organizations: Not on file    Relationship status: Not on file  . Intimate partner violence    Fear of current or ex partner: Not on file    Emotionally abused: Not on file    Physically abused: Not on file    Forced sexual activity: Not on file  Other Topics Concern  . Not on file  Social History Narrative  . Not on file    Current  Outpatient Medications on File Prior to Visit  Medication Sig Dispense Refill  . Acetaminophen-Caffeine (EXCEDRIN TENSION HEADACHE) 500-65 MG TABS Take 2 tablets by mouth daily as needed (pain).     . cetirizine (ZYRTEC) 10 MG tablet Take 10 mg by mouth daily.     No current facility-administered medications on file prior to visit.     Allergies  Allergen Reactions  . Other Rash    Per pt medication to treat MRSA - unsure of name     Family History  Problem Relation Age of Onset  . Thyroid disease Neg Hx     BP 124/72 (BP Location: Right Arm, Patient Position: Sitting, Cuff Size: Normal)   Pulse 64   Ht 5\' 4"  (1.626 m)   Wt 145 lb 9.6 oz (66 kg)   SpO2 98%   BMI 24.99 kg/m   Review of Systems Denies fever.      Objective:   Physical Exam VITAL SIGNS:  See vs page GENERAL: no distress NECK: There is no palpable thyroid enlargement.  No thyroid nodule is palpable.  No palpable lymphadenopathy at the  anterior neck.  Lab Results  Component Value Date   TSH 4.60 (H) 10/17/2019   T3TOTAL 557 (H) 02/24/2017       Assessment & Plan:  Hyperthyroidism: overcontrolled.  Reduce tapazole  Patient Instructions  blood tests are requested for you today.  We'll let you know about the results.  If ever you have fever while taking methimazole, stop it and call us, even if the reason is obvious, because of the risk of a rare side-effect.   It is best to never miss a dose.  However, if you do miss, next best is to double up the next time.   Please come back for a follow-up appointment in 4-6 months.

## 2019-10-18 MED ORDER — METHIMAZOLE 10 MG PO TABS: 10.0000 mg | ORAL_TABLET | Freq: Every day | ORAL | 1 refills | Status: DC

## 2020-02-14 ENCOUNTER — Ambulatory Visit: Payer: Managed Care, Other (non HMO) | Admitting: Endocrinology

## 2020-02-14 DIAGNOSIS — Z0289 Encounter for other administrative examinations: Secondary | ICD-10-CM

## 2020-04-12 ENCOUNTER — Encounter: Payer: Self-pay | Admitting: Endocrinology

## 2020-04-12 ENCOUNTER — Ambulatory Visit: Payer: No Typology Code available for payment source | Admitting: Endocrinology

## 2020-04-12 ENCOUNTER — Other Ambulatory Visit: Payer: Self-pay

## 2020-04-12 VITALS — BP 118/60 | HR 72 | Ht 64.0 in | Wt 151.0 lb

## 2020-04-12 DIAGNOSIS — E0500 Thyrotoxicosis with diffuse goiter without thyrotoxic crisis or storm: Secondary | ICD-10-CM | POA: Insufficient documentation

## 2020-04-12 DIAGNOSIS — E0590 Thyrotoxicosis, unspecified without thyrotoxic crisis or storm: Secondary | ICD-10-CM | POA: Diagnosis not present

## 2020-04-12 LAB — TSH: TSH: 2.51 u[IU]/mL (ref 0.35–4.50)

## 2020-04-12 LAB — T4, FREE: Free T4: 0.44 ng/dL — ABNORMAL LOW (ref 0.60–1.60)

## 2020-04-12 NOTE — Progress Notes (Signed)
Subjective:    Patient ID: [name], female    DOB: 27-Jan-1961, 59 y.o.   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  pt states she feels well in general.  Specifically, she denies palpitations and tremor.  She takes tapazole as rx'ed.  chronic anterior neck swelling is unchanged, but eyes are bothering her more recently Past Medical History:  Diagnosis Date  . Herniated disc     No past surgical history on file.  Social History   Socioeconomic History  . Marital status: Married    Spouse name: Not on file  . Number of children: Not on file  . Years of education: Not on file  . Highest education level: Not on file  Occupational History  . Not on file  Tobacco Use  . Smoking status: Current Every Day Smoker  . Smokeless tobacco: Never Used  Substance and Sexual Activity  . Alcohol use: No  . Drug use: No  . Sexual activity: Not on file  Other Topics Concern  . Not on file  Social History Narrative  . Not on file   Social Determinants of Health   Financial Resource Strain:   . Difficulty of Paying Living Expenses:   Food Insecurity:   . Worried About Programme researcher, broadcasting/film/video in the Last Year:   . Barista in the Last Year:   Transportation Needs:   . Freight forwarder (Medical):   Marland Kitchen Lack of Transportation (Non-Medical):   Physical Activity:   . Days of Exercise per Week:   . Minutes of Exercise per Session:   Stress:   . Feeling of Stress :   Social Connections:   . Frequency of Communication with Friends and Family:   . Frequency of Social Gatherings with Friends and Family:   . Attends Religious Services:   . Active Member of Clubs or Organizations:   . Attends Banker Meetings:   Marland Kitchen  Marital Status:   Intimate Partner Violence:   . Fear of Current or Ex-Partner:   . Emotionally Abused:   Marland Kitchen Physically Abused:   . Sexually Abused:     Current Outpatient Medications on File Prior to Visit  Medication Sig Dispense Refill  . Acetaminophen-Caffeine (EXCEDRIN TENSION HEADACHE) 500-65 MG TABS Take 2 tablets by mouth daily as needed (pain).     . cetirizine (ZYRTEC) 10 MG tablet Take 10 mg by mouth daily.    . methimazole (TAPAZOLE) 10 MG tablet Take 1 tablet (10 mg total) by mouth daily. 90 tablet 1   No current facility-administered medications on file prior to visit.    Allergies  Allergen Reactions  . Other Rash    Per pt medication to treat MRSA - unsure of name     Family History  Problem Relation Age of Onset  . Thyroid disease Neg Hx     BP 118/60   Pulse 72   Ht 5\' 4"  (1.626 m)   Wt 151 lb (68.5 kg)   SpO2 98%   BMI 25.92 kg/m    Review of Systems Denies fever    Objective:   Physical Exam VITAL SIGNS:  See vs page GENERAL: no distress EYES: slight bilat proptosis NECK: Thyroid  is approx 10x normal size--diffuse.  No thyroid nodule is palpable.  No palpable lymphadenopathy at the anterior neck.     Lab Results  Component Value Date   TSH 2.51 04/12/2020   T3TOTAL 557 (H) 02/24/2017      Assessment & Plan:  Grave's eye Dz: worse: we discussed the fact that there is medication available for this now.  Hyperthyroidism: well-controlled.  Please continue the same medications  Patient Instructions  Blood tests are requested for you today.  We'll let you know about the results.  If ever you have fever while taking methimazole, stop it and call us, even if the reason is obvious, because of the risk of a rare side-effect. It is best to never miss the medication.  However, if you do miss it, next best is to double up the next time.   Please see an eye specialist.  you will receive a phone call, about a day and time for an appointment Please  come back for a follow-up appointment in 4-6 months.

## 2020-04-12 NOTE — Patient Instructions (Addendum)
Blood tests are requested for you today.  We'll let you know about the results.  If ever you have fever while taking methimazole, stop it and call us, even if the reason is obvious, because of the risk of a rare side-effect. It is best to never miss the medication.  However, if you do miss it, next best is to double up the next time.   Please see an eye specialist.  you will receive a phone call, about a day and time for an appointment Please come back for a follow-up appointment in 4-6 months.

## 2020-04-24 ENCOUNTER — Telehealth: Payer: Self-pay | Admitting: Endocrinology

## 2020-04-24 NOTE — Telephone Encounter (Signed)
Please refer to message below. Not certain if a referral needs placed for those listed below OR if pt just needs to be scheduled at one of these locations by [facility].

## 2020-04-24 NOTE — Telephone Encounter (Signed)
Patient has an appointment with [facility] on 05-27-20 at 115.  Patient notified of appointment information.

## 2020-04-24 NOTE — Telephone Encounter (Signed)
Dr Ashley Royalty is an Opthamologist but he specializes in retina - so they are recommending patient to be referred to just a general Opthamologist.    List of general Opthamologists :  [facility] [phone] [facility] [phone]

## 2020-04-27 ENCOUNTER — Other Ambulatory Visit: Payer: Self-pay | Admitting: Endocrinology

## 2020-05-27 LAB — HM DIABETES EYE EXAM

## 2020-09-12 ENCOUNTER — Encounter: Payer: Self-pay | Admitting: Endocrinology

## 2020-09-12 ENCOUNTER — Ambulatory Visit: Payer: No Typology Code available for payment source | Admitting: Endocrinology

## 2020-09-12 ENCOUNTER — Other Ambulatory Visit: Payer: Self-pay

## 2020-09-12 VITALS — BP 144/78 | HR 68 | Ht 64.0 in | Wt 158.0 lb

## 2020-09-12 DIAGNOSIS — E0590 Thyrotoxicosis, unspecified without thyrotoxic crisis or storm: Secondary | ICD-10-CM | POA: Diagnosis not present

## 2020-09-12 LAB — T4, FREE: Free T4: 0.36 ng/dL — ABNORMAL LOW (ref 0.60–1.60)

## 2020-09-12 LAB — TSH: TSH: 3.61 u[IU]/mL (ref 0.35–4.50)

## 2020-09-12 NOTE — Progress Notes (Signed)
Subjective:    Patient ID: [name], female    DOB: [birth year], 59 y.o.   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  pt states she feels well in general, except for fatigue and weight gain. She takes tapazole as rx'ed.   Past Medical History:  Diagnosis Date  . Herniated disc     No past surgical history on file.  Social History   Socioeconomic History  . Marital status: Married    Spouse name: Not on file  . Number of children: Not on file  . Years of education: Not on file  . Highest education level: Not on file  Occupational History  . Not on file  Tobacco Use  . Smoking status: Current Every Day Smoker  . Smokeless tobacco: Never Used  Substance and Sexual Activity  . Alcohol use: No  . Drug use: No  . Sexual activity: Not on file  Other Topics Concern  . Not on file  Social History Narrative  . Not on file   Social Determinants of Health   Financial Resource Strain:   . Difficulty of Paying Living Expenses: Not on file  Food Insecurity:   . Worried About Programme researcher, broadcasting/film/video in the Last Year: Not on file  . Ran Out of Food in the Last Year: Not on file  Transportation Needs:   . Lack of Transportation (Medical): Not on file  . Lack of Transportation (Non-Medical): Not on file  Physical Activity:   . Days of Exercise per Week: Not on file  . Minutes of Exercise per Session: Not on file  Stress:   . Feeling of Stress : Not on file  Social Connections:   . Frequency of Communication with Friends and Family: Not on file  . Frequency of Social Gatherings with Friends and Family: Not on file  . Attends Religious Services: Not on file  . Active Member of Clubs or Organizations: Not on file  . Attends Tax inspector Meetings: Not on file  . Marital Status: Not on file  Intimate Partner Violence:   . Fear of Current or Ex-Partner: Not on file  . Emotionally Abused: Not on file  . Physically Abused: Not on file  . Sexually Abused: Not on file    Current Outpatient Medications on File Prior to Visit  Medication Sig Dispense Refill  . Acetaminophen-Caffeine (EXCEDRIN TENSION HEADACHE) 500-65 MG TABS Take 2 tablets by mouth daily as needed (pain).     . cetirizine (ZYRTEC) 10 MG tablet Take 10 mg by mouth daily.    . methimazole (TAPAZOLE) 10 MG tablet TAKE 1 TABLET BY MOUTH TWICE DAILY 60 tablet 5   No current facility-administered medications on file prior to visit.    Allergies  Allergen Reactions  . Other Rash    Per pt medication to treat MRSA - unsure of name     Family History  Problem Relation Age of Onset  . Thyroid disease Neg Hx     BP (!) 144/78   Pulse 68   Ht 5\' 4"  (1.626 m)   Wt 158 lb (71.7 kg)   SpO2 97%   BMI 27.12 kg/m    Review of Systems Denies fever.  Objective:   Physical Exam VITAL SIGNS:  See vs page GENERAL: no distress EYES: slight bilat proptosis NECK: Thyroid is approx 10x normal size--diffuse.  No thyroid nodule is palpable.  No palpable lymphadenopathy at the anterior neck.   Lab Results  Component Value Date   TSH 3.61 09/12/2020   T3TOTAL 557 (H) 02/24/2017       Assessment & Plan:  Hyperthyroidism: well-controlled.  Please continue the same methimazole

## 2020-09-12 NOTE — Patient Instructions (Addendum)
Your blood pressure is high today.  Please see your primary care provider soon, to have it rechecked Blood tests are requested for you today.  We'll let you know about the results.  If ever you have fever while taking methimazole, stop it and call us, even if the reason is obvious, because of the risk of a rare side-effect.  It is best to never miss the medication.  However, if you do miss it, next best is to double up the next time.   Please come back for a follow-up appointment in 6 months.   

## 2020-12-18 ENCOUNTER — Other Ambulatory Visit: Payer: No Typology Code available for payment source

## 2020-12-18 ENCOUNTER — Other Ambulatory Visit: Payer: Self-pay

## 2020-12-18 DIAGNOSIS — Z20822 Contact with and (suspected) exposure to covid-19: Secondary | ICD-10-CM

## 2020-12-20 LAB — NOVEL CORONAVIRUS, NAA: SARS-CoV-2, NAA: NOT DETECTED

## 2020-12-20 LAB — SARS-COV-2, NAA 2 DAY TAT

## 2021-01-09 ENCOUNTER — Other Ambulatory Visit: Payer: Self-pay

## 2021-01-09 ENCOUNTER — Ambulatory Visit
Admission: EM | Admit: 2021-01-09 | Discharge: 2021-01-09 | Disposition: A | Discharge status: Home / Self Care | Payer: No Typology Code available for payment source | Attending: Family Medicine | Admitting: Family Medicine

## 2021-01-09 ENCOUNTER — Encounter: Payer: Self-pay | Admitting: Emergency Medicine

## 2021-01-09 DIAGNOSIS — J209 Acute bronchitis, unspecified: Secondary | ICD-10-CM | POA: Diagnosis not present

## 2021-01-09 MED ORDER — PREDNISONE 20 MG PO TABS: 40.0000 mg | ORAL_TABLET | Freq: Every day | ORAL | 0 refills | Status: DC

## 2021-01-09 MED ORDER — PROMETHAZINE-DM 6.25-15 MG/5ML PO SYRP: 5.0000 mL | ORAL_SOLUTION | Freq: Four times a day (QID) | ORAL | 0 refills | Status: DC | PRN

## 2021-01-09 MED ORDER — CEFDINIR 300 MG PO CAPS: 300.0000 mg | ORAL_CAPSULE | Freq: Two times a day (BID) | ORAL | 0 refills | Status: AC

## 2021-01-09 NOTE — ED Triage Notes (Signed)
Pt here for cold sx onset 2 months but last 5 days have been worse  Hx of CFH and Graves disease  Sx today include: cough, fatigue, chest congestion  Also reports she had a COVID test 4 days ago and it was negative.   Denies f/v/n/d  Smokes 1 PPD  Taking OTC Mucinex and Robitussin w/no relief.   A&O x4... NAD.Marland Kitchen. ambulatory

## 2021-01-09 NOTE — Discharge Instructions (Addendum)
Use your albuterol inhaler 2 puffs every 4-6 hours as needed for shortness of breath or chest tightness. For shortness of breath I am prescribing 40 mg of prednisone this will help decrease the inflammation in your lungs and open your airways up so that you can breathe more effectively and with out increased effort. Start antibiotics cefdinir 1 tablet twice daily over the course the next 10 days complete entire prescription. For management of cough Promethazine DM 5 mL 4 times a day as needed for cough.  If you develop fever or have any worsening shortness of breath current regimen or develop any chest pain go immediately to the emergency department.

## 2021-01-11 NOTE — ED Provider Notes (Signed)
[facility]    CSN: [id number] Arrival date & time: 01/09/21  1949      History   Chief Complaint Chief Complaint  Patient presents with  . URI    HPI [name] is a 60 y.o. female.   HPI  Patient presents today for evaluation of ongoing cough, shortness of breath, fatigue.  Patient has a medical history significant for emphysema, pneumonia, and chronic tobacco use.  At present she is not prescribed any inhaled corticosteroids or rescue inhalers.  She denies any fever or any known sick contacts.  She endorses some mild chest tightness with coughing and deep breathing. Denies any other associated URI symptoms. Past Medical History:  Diagnosis Date  . Herniated disc     Patient Active Problem List   Diagnosis Date Noted  . 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 Hyperthyroidism 04/13/2017  . Tobacco use disorder 03/08/2017  . Atherosclerosis 03/08/2017  . Coronavirus infection 02/25/2017  . Cough 02/25/2017  . Emphysema lung (HCC) 02/25/2017  . Shortness of breath 02/23/2017  . Elevated brain natriuretic peptide (BNP) level 02/23/2017  . Bronchopneumonia 02/23/2017  . Lower extremity edema 02/23/2017  . Anemia 02/23/2017  . Elevated alkaline phosphatase level 02/23/2017  . Acute systolic congestive heart failure (HCC)   . Hypokalemia     History reviewed. No pertinent surgical history.  OB History   No obstetric history on file.      Home Medications    Prior to Admission medications   Medication Sig Start Date End Date Taking? Authorizing Provider  cefdinir (OMNICEF) 300 MG capsule Take 1 capsule (300 mg total) by mouth 2 (two) times daily for 10 days. 01/09/21 01/19/21 Yes Bing Neighbors, FNP  predniSONE (DELTASONE) 20 MG tablet Take 2 tablets (40 mg total) by mouth daily with breakfast. 01/09/21  Yes Bing Neighbors, FNP  promethazine-dextromethorphan (PROMETHAZINE-DM) 6.25-15 MG/5ML syrup Take 5 mLs by mouth 4 (four) times daily as needed  for cough. 01/09/21  Yes Bing Neighbors, FNP  Acetaminophen-Caffeine [facility] TENSION HEADACHE) 500-65 MG TABS Take 2 tablets by mouth daily as needed (pain).     [provider]  cetirizine (ZYRTEC) 10 MG tablet Take 10 mg by mouth daily.    [provider]  methimazole (TAPAZOLE) 10 MG tablet TAKE 1 TABLET BY MOUTH TWICE DAILY 04/27/20   Romero Belling, MD    Family History Family History  Problem Relation Age of Onset  . Thyroid disease Neg Hx     Social History Social History   Tobacco Use  . Smoking status: Current Every Day Smoker  . Smokeless tobacco: Never Used  Substance Use Topics  . Alcohol use: No  . Drug use: No     Allergies   Other  Review of Systems Review of Systems Pertinent negatives listed in HPI  Physical Exam Triage Vital Signs ED Triage Vitals  Enc Vitals Group     BP 01/09/21 1957 (!) 173/77     Pulse Rate 01/09/21 1957 71     Resp 01/09/21 1957 20     Temp 01/09/21 1957 98.2 F (36.8 C)     Temp Source 01/09/21 1957 Oral     SpO2 01/09/21 1957 95 %     Weight --      Height --      Head Circumference --      Peak Flow --      Pain Score 01/09/21 1958 4     Pain Loc --  Pain Edu? --      Excl. in GC? --    No data found.  Updated Vital Signs BP (!) 173/77 (BP Location: Left Arm)   Pulse 71   Temp 98.2 F (36.8 C) (Oral)   Resp 20   SpO2 95%   Visual Acuity Right Eye Distance:   Left Eye Distance:   Bilateral Distance:    Right Eye Near:   Left Eye Near:    Bilateral Near:     Physical Exam General appearance: alert, Ill-appearing, cooperative, no distress Head: Normocephalic, without obvious abnormality, atraumatic ENT: External ears normal, nares patent without discharge, oropharynx patent w/o exudate Respiratory: Respirations even , unlabored, coarse lung sound, rhonchi present R/L upper bronchials, expiratory wheeze Heart: rate and rhythm normal. No gallop or murmurs noted on exam  Abdomen:  BS +, no distention, no rebound tenderness, or no mass Extremities: No gross deformities Skin: Skin color, texture, turgor normal. No rashes seen  Psych: Appropriate mood and affect. Neurologic: GCS 15, normal coordination, normal gait UC Treatments / Results  Labs (all labs ordered are listed, but only abnormal results are displayed) Labs Reviewed - No data to display  EKG   Radiology No results found.  Procedures Procedures (including critical care time)  Medications Ordered in UC Medications - No data to display  Initial Impression / Assessment and Plan / UC Course  I have reviewed the triage vital signs and the nursing notes.  Pertinent labs & imaging results that were available during my care of the patient were reviewed by me and considered in my medical decision making (see chart for details).    COVID-19 test pending.  Patient has a history of chronic lung disease will cover for bronchitis with cefdinir x10 days, promethazine DM 4 times daily as needed for cough.  For shortness of breath and chest inflammation start prednisone 40 mg with breakfast daily over the next 5 days and dispensed albuterol inhaler 2 puffs every 4-6 hours as needed for shortness of breath or wheezing with use of a chamber.  Red flags discussed with patient warranting immediate evaluation in the setting of the ER.  Patient verbalized understanding and agreement with plan. Final Clinical Impressions(s) / UC Diagnoses   Final diagnoses:  Acute bronchitis, unspecified organism     Discharge Instructions     Use your albuterol inhaler 2 puffs every 4-6 hours as needed for shortness of breath or chest tightness. For shortness of breath I am prescribing 40 mg of prednisone this will help decrease the inflammation in your lungs and open your airways up so that you can breathe more effectively and with out increased effort. Start antibiotics cefdinir 1 tablet twice daily over the course the next 10 days  complete entire prescription. For management of cough Promethazine DM 5 mL 4 times a day as needed for cough.  If you develop fever or have any worsening shortness of breath current regimen or develop any chest pain go immediately to the emergency department.   ED Prescriptions    Medication Sig Dispense Auth. Provider   predniSONE (DELTASONE) 20 MG tablet Take 2 tablets (40 mg total) by mouth daily with breakfast. 10 tablet Bing Neighbors, FNP   cefdinir (OMNICEF) 300 MG capsule Take 1 capsule (300 mg total) by mouth 2 (two) times daily for 10 days. 20 capsule Bing Neighbors, FNP   promethazine-dextromethorphan (PROMETHAZINE-DM) 6.25-15 MG/5ML syrup Take 5 mLs by mouth 4 (four) times daily as needed for cough.  140 mL Bing Neighbors, FNP     PDMP not reviewed this encounter.   Bing Neighbors, [place] 01/11/21 [phone]

## 2021-02-11 ENCOUNTER — Other Ambulatory Visit: Payer: Self-pay | Admitting: Endocrinology

## 2021-02-13 ENCOUNTER — Other Ambulatory Visit: Payer: Self-pay

## 2021-02-13 ENCOUNTER — Ambulatory Visit
Admission: EM | Admit: 2021-02-13 | Discharge: 2021-02-13 | Disposition: A | Discharge status: Home / Self Care | Payer: No Typology Code available for payment source | Attending: Family Medicine | Admitting: Family Medicine

## 2021-02-13 ENCOUNTER — Ambulatory Visit (INDEPENDENT_AMBULATORY_CARE_PROVIDER_SITE_OTHER): Payer: No Typology Code available for payment source

## 2021-02-13 DIAGNOSIS — Z720 Tobacco use: Secondary | ICD-10-CM

## 2021-02-13 DIAGNOSIS — R062 Wheezing: Secondary | ICD-10-CM

## 2021-02-13 DIAGNOSIS — R0602 Shortness of breath: Secondary | ICD-10-CM

## 2021-02-13 DIAGNOSIS — J069 Acute upper respiratory infection, unspecified: Secondary | ICD-10-CM | POA: Diagnosis not present

## 2021-02-13 DIAGNOSIS — R059 Cough, unspecified: Secondary | ICD-10-CM

## 2021-02-13 MED ORDER — PROMETHAZINE-DM 6.25-15 MG/5ML PO SYRP: 5.0000 mL | ORAL_SOLUTION | Freq: Four times a day (QID) | ORAL | 0 refills | Status: DC | PRN

## 2021-02-13 MED ORDER — METHYLPREDNISOLONE SODIUM SUCC 125 MG IJ SOLR
125.0000 mg | Freq: Once | INTRAMUSCULAR | Status: AC
  Administered 2021-02-13: 125 mg via INTRAMUSCULAR

## 2021-02-13 MED ORDER — DOXYCYCLINE HYCLATE 100 MG PO CAPS: 100.0000 mg | ORAL_CAPSULE | Freq: Two times a day (BID) | ORAL | 0 refills | Status: DC

## 2021-02-13 MED ORDER — ALBUTEROL SULFATE HFA 108 (90 BASE) MCG/ACT IN AERS: 2.0000 | INHALATION_SPRAY | RESPIRATORY_TRACT | 0 refills | Status: AC | PRN

## 2021-02-13 NOTE — ED Provider Notes (Signed)
[facility]   [id number] 02/13/21 Arrival Time: 1907   CC: COVID symptoms  SUBJECTIVE: History from: patient.  [name] is a 60 y.o. female who presents with cough, SOB, wheezing for the last month. Has hx Covid, bronchopneumonia, emphysema, tobacco dependence, and hyperthyroidism. Was seen in this office last month and was treated with steroids, cefdinir, and promethazine cough syrup. Denies sick exposure to COVID, flu or strep. Denies recent travel. Has not completed Covid vaccines. Symptoms are worse with activity. Reports previous symptoms in the past. Denies fever, chills, sinus pain, rhinorrhea, sore throat, nausea, changes in bowel or bladder habits.    ROS: As per HPI.  All other pertinent ROS negative.     Past Medical History:  Diagnosis Date  . Herniated disc    History reviewed. No pertinent surgical history. Allergies  Allergen Reactions  . Other Rash    Per pt medication to treat MRSA - unsure of name    No current facility-administered medications on file prior to encounter.   Current Outpatient Medications on File Prior to Encounter  Medication Sig Dispense Refill  . Acetaminophen-Caffeine (EXCEDRIN TENSION HEADACHE) 500-65 MG TABS Take 2 tablets by mouth daily as needed (pain).     . cetirizine (ZYRTEC) 10 MG tablet Take 10 mg by mouth daily.    . methimazole (TAPAZOLE) 10 MG tablet TAKE 1 TABLET BY MOUTH TWICE DAILY 180 tablet 0  . predniSONE (DELTASONE) 20 MG tablet Take 2 tablets (40 mg total) by mouth daily with breakfast. 10 tablet 0   Social History   Socioeconomic History  . Marital status: Single    Spouse name: Not on file  . Number of children: Not on file  . Years of education: Not on file  . Highest education level: Not on file  Occupational History  . Not on file  Tobacco Use  . Smoking status: Current Every Day Smoker  . Smokeless tobacco: Never Used  Substance and Sexual Activity  . Alcohol use: No  . Drug use: No  .  Sexual activity: Not on file  Other Topics Concern  . Not on file  Social History Narrative  . Not on file   Social Determinants of Health   Financial Resource Strain: Not on file  Food Insecurity: Not on file  Transportation Needs: Not on file  Physical Activity: Not on file  Stress: Not on file  Social Connections: Not on file  Intimate Partner Violence: Not on file   Family History  Problem Relation Age of Onset  . Thyroid disease Neg Hx     OBJECTIVE:  Vitals:   02/13/21 1934  BP: (!) 171/87  Pulse: 63  Resp: 20  Temp: 97.8 F (36.6 C)  SpO2: 98%     General appearance: alert; appears fatigued, but nontoxic; speaking in full sentences and tolerating own secretions HEENT: NCAT; Ears: EACs clear, TMs pearly gray; Eyes: PERRL.  EOM grossly intact. Sinuses: nontender; Nose: nares patent with clear rhinorrhea, Throat: oropharynx erythematous, cobblestoning present, tonsils non erythematous or enlarged, uvula midline  Neck: supple without LAD Lungs: unlabored respirations, symmetrical air entry; cough: moderate; no respiratory distress; distant lung sounds, diminished lung sounds, mild wheezing to bilateral lower lobes Heart: regular rate and rhythm.  Radial pulses 2+ symmetrical bilaterally Skin: warm and dry Psychological: alert and cooperative; normal mood and affect  LABS:  No results found for this or any previous visit (from the past 24 hour(s)).   ASSESSMENT & PLAN:  1.  Upper respiratory tract infection, unspecified type   2. Cough   3. SOB (shortness of breath)   4. Wheezing     Meds ordered this encounter  Medications  . doxycycline (VIBRAMYCIN) 100 MG capsule    Sig: Take 1 capsule (100 mg total) by mouth 2 (two) times daily.    Dispense:  14 capsule    Refill:  0    Order Specific Question:   Supervising Provider    Answer:   Merrilee Jansky [id number]  . promethazine-dextromethorphan (PROMETHAZINE-DM) 6.25-15 MG/5ML syrup    Sig: Take 5 mLs by  mouth 4 (four) times daily as needed for cough.    Dispense:  140 mL    Refill:  0    Order Specific Question:   Supervising Provider    Answer:   Merrilee Jansky [id number]  . albuterol (VENTOLIN HFA) 108 (90 Base) MCG/ACT inhaler    Sig: Inhale 2 puffs into the lungs every 4 (four) hours as needed for wheezing or shortness of breath.    Dispense:  18 g    Refill:  0    Order Specific Question:   Supervising Provider    Answer:   Merrilee Jansky [id number]  . methylPREDNISolone sodium succinate (SOLU-MEDROL) 125 mg/2 mL injection 125 mg    Chest xray negative for pneumonia Solumedrol 125mg  IM in office today Prescribed doxycycline 100mg  BID x 7 days Prescribed albuterol inhaler Promethazine cough syrup prescribed Sedation precautions given Continue supportive care at home Get plenty of rest and push fluids Use OTC zyrtec for nasal congestion, runny nose, and/or sore throat Use OTC flonase for nasal congestion and runny nose Use medications daily for symptom relief Use OTC medications like ibuprofen or tylenol as needed fever or pain Call or go to the ED if you have any new or worsening symptoms such as fever, worsening cough, shortness of breath, chest tightness, chest pain, turning blue, changes in mental status.  Reviewed expectations re: course of current medical issues. Questions answered. Outlined signs and symptoms indicating need for more acute intervention. Patient verbalized understanding. After Visit Summary given.         , NP 02/14/21 [phone]

## 2021-02-13 NOTE — Discharge Instructions (Addendum)
I have sent in doxycycline for you to take one tablet twice a day for 10 days  I have sent in cough syrup for you to take. This medication can make you sleepy. Do not drive while taking this medication.  We have given you a steroid injection in the office to help open you up  I have sent in an albuterol inhaler for you to use 2 puffs every 4-6 hours as needed for cough, shortness of breath, wheezing.  Follow up with this office or with primary care if symptoms are persisting.  Follow up in the ER for high fever, trouble swallowing, trouble breathing, other concerning symptoms.

## 2021-02-13 NOTE — ED Triage Notes (Signed)
Pt presents with cough and chest congestion for past few months , was seen in February for same and no improvement in symptoms

## 2021-03-12 ENCOUNTER — Ambulatory Visit: Payer: No Typology Code available for payment source | Admitting: Endocrinology

## 2021-03-12 ENCOUNTER — Other Ambulatory Visit: Payer: Self-pay

## 2021-03-12 VITALS — BP 158/90 | HR 58 | Ht 64.0 in | Wt 163.2 lb

## 2021-03-12 DIAGNOSIS — E0590 Thyrotoxicosis, unspecified without thyrotoxic crisis or storm: Secondary | ICD-10-CM | POA: Diagnosis not present

## 2021-03-12 LAB — T4, FREE: Free T4: 0.43 ng/dL — ABNORMAL LOW (ref 0.60–1.60)

## 2021-03-12 LAB — TSH: TSH: 5.21 u[IU]/mL — ABNORMAL HIGH (ref 0.35–4.50)

## 2021-03-12 MED ORDER — METHIMAZOLE 5 MG PO TABS: 5.0000 mg | ORAL_TABLET | Freq: Every day | ORAL | 3 refills | Status: DC

## 2021-03-12 MED ORDER — METHIMAZOLE 10 MG PO TABS: 10.0000 mg | ORAL_TABLET | Freq: Every day | ORAL | 3 refills | Status: DC

## 2021-03-12 NOTE — Patient Instructions (Signed)
Your blood pressure is high today.  Please see your primary care provider soon, to have it rechecked Blood tests are requested for you today.  We'll let you know about the results.  If ever you have fever while taking methimazole, stop it and call us, even if the reason is obvious, because of the risk of a rare side-effect.  It is best to never miss the medication.  However, if you do miss it, next best is to double up the next time.   Please come back for a follow-up appointment in 6 months.   

## 2021-03-12 NOTE — Progress Notes (Signed)
   Subjective:    Patient ID: [name], female    DOB: 1961-09-03, 60 y.o.   MRN: [id number]  HPI Pt returns for f/u of hyperthyroidism (dx'ed in early 2018; she has never had thyroid imaging, but Grave's Dz is suggested by severity and phys exam; tapazole was chosen as initial rx, due to severity--she wishes to continue, as she cannot be separated from her grandson).  pt states she feels well in general. She takes tapazole 10 mg qd.   Past Medical History:  Diagnosis Date  . Herniated disc     No past surgical history on file.  Social History   Socioeconomic History  . Marital status: Single    Spouse name: Not on file  . Number of children: Not on file  . Years of education: Not on file  . Highest education level: Not on file  Occupational History  . Not on file  Tobacco Use  . Smoking status: Current Every Day Smoker  . Smokeless tobacco: Never Used  Substance and Sexual Activity  . Alcohol use: No  . Drug use: No  . Sexual activity: Not on file  Other Topics Concern  . Not on file  Social History Narrative  . Not on file   Social Determinants of Health   Financial Resource Strain: Not on file  Food Insecurity: Not on file  Transportation Needs: Not on file  Physical Activity: Not on file  Stress: Not on file  Social Connections: Not on file  Intimate Partner Violence: Not on file    Current Outpatient Medications on File Prior to Visit  Medication Sig Dispense Refill  . Acetaminophen-Caffeine 500-65 MG TABS Take 2 tablets by mouth daily as needed (pain).     Marland Kitchen albuterol (VENTOLIN HFA) 108 (90 Base) MCG/ACT inhaler Inhale 2 puffs into the lungs every 4 (four) hours as needed for wheezing or shortness of breath. 18 g 0  . cetirizine (ZYRTEC) 10 MG tablet Take 10 mg by mouth daily.     No current facility-administered medications on file prior to visit.    Allergies  Allergen Reactions  . Other Rash    Per pt medication to treat MRSA - unsure of name      Family History  Problem Relation Age of Onset  . Thyroid disease Neg Hx     BP (!) 158/90 (BP Location: Right Arm, Patient Position: Sitting, Cuff Size: Normal)   Pulse (!) 58   Ht 5\' 4"  (1.626 m)   Wt 163 lb 3.2 oz (74 kg)   SpO2 93%   BMI 28.01 kg/m    Review of Systems Denies fever.      Objective:   Physical Exam VITAL SIGNS:  See vs page GENERAL: no distress EYES: slight bilat proptosis NECK: Thyroid is approx 10x normal size (R>L).  No thyroid nodule is palpable.  No palpable lymphadenopathy at the anterior neck.   Lab Results  Component Value Date   TSH 5.21 (H) 03/12/2021   T3TOTAL 557 (H) 02/24/2017       Assessment & Plan:  Hyperthyroidism: overcontrolled: reduce tapazole to 5 mg per day

## 2021-05-09 IMAGING — DX DG CHEST 2V
2 series · 2 of 2 positions shown · non-contrast
Comparison: Radiograph and CT 02/23/2017

CLINICAL DATA: Dry cough for 2 months. Shortness of breath. Current
smoker.

EXAM:
CHEST - 2 VIEW

[chest pa]
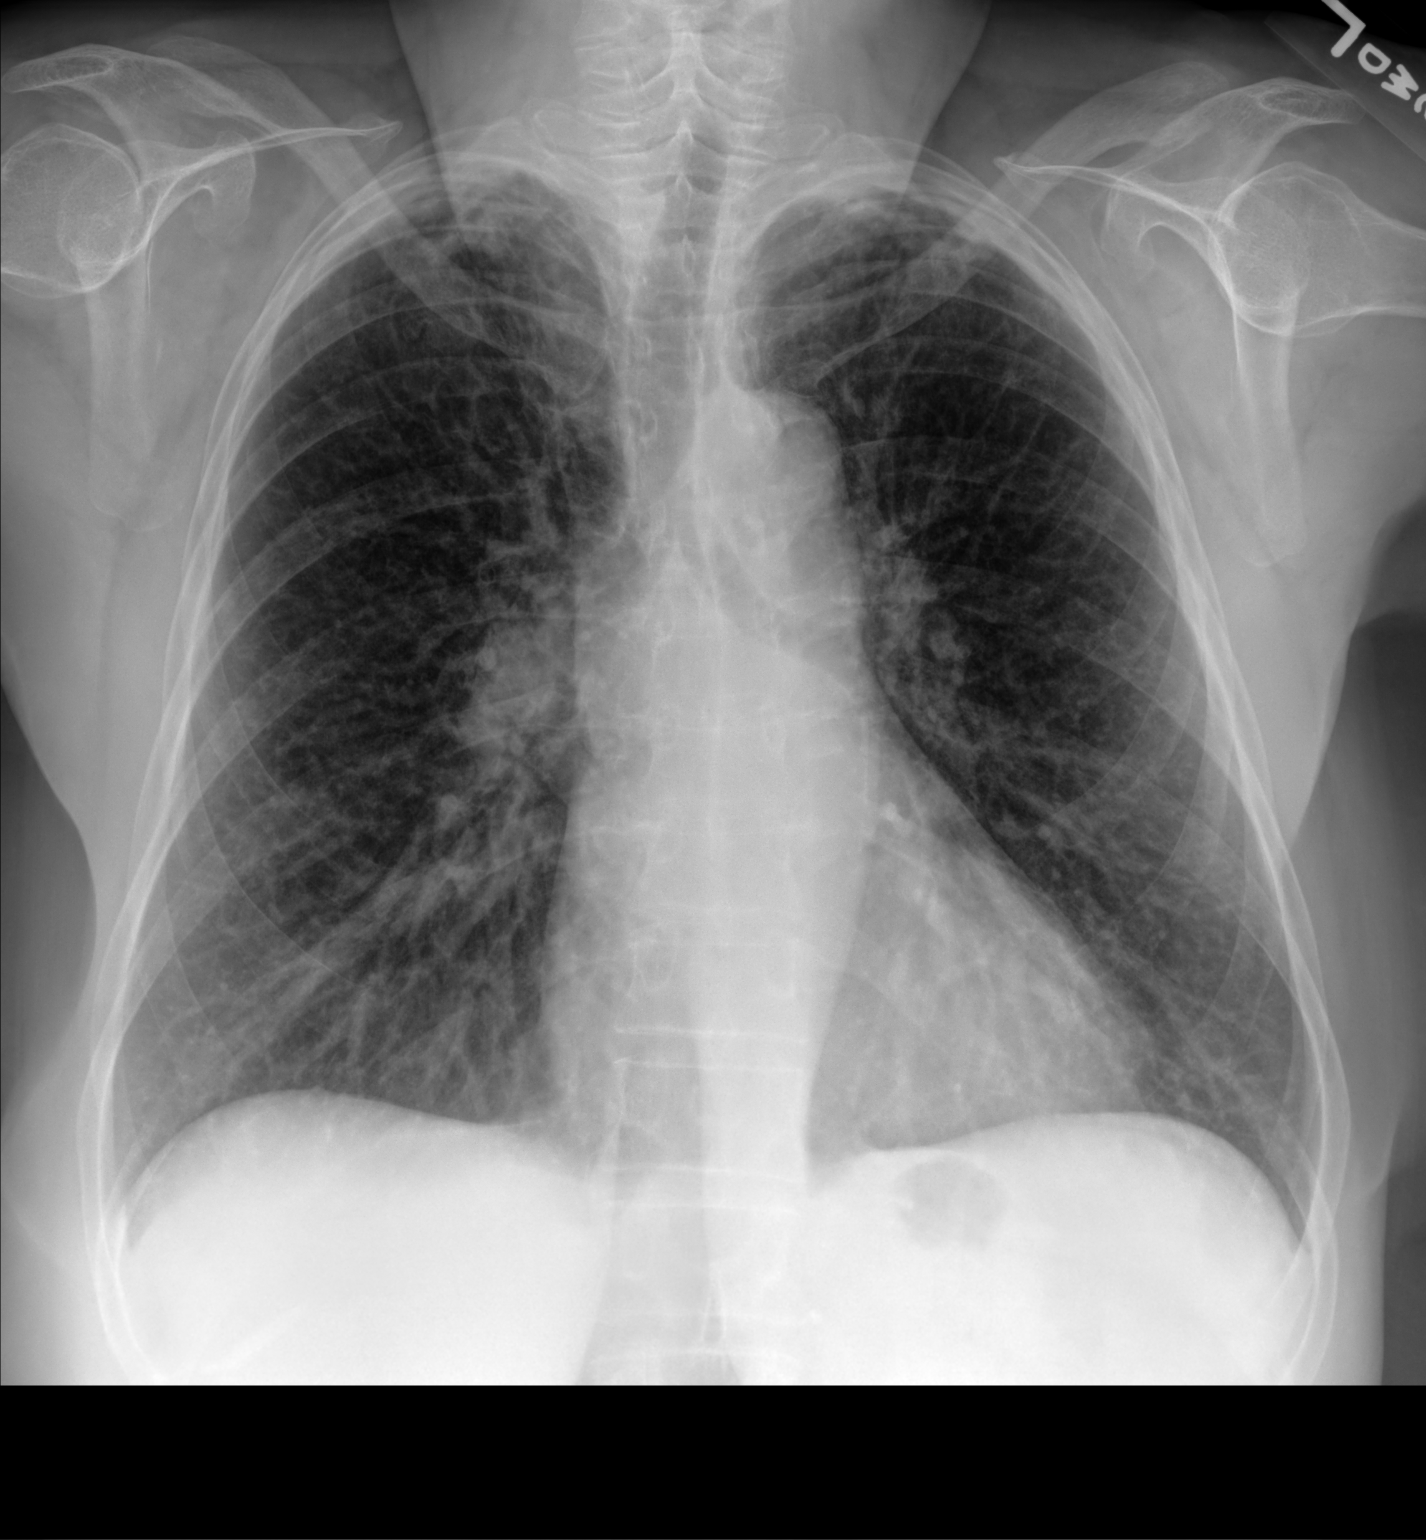

[chest lat]
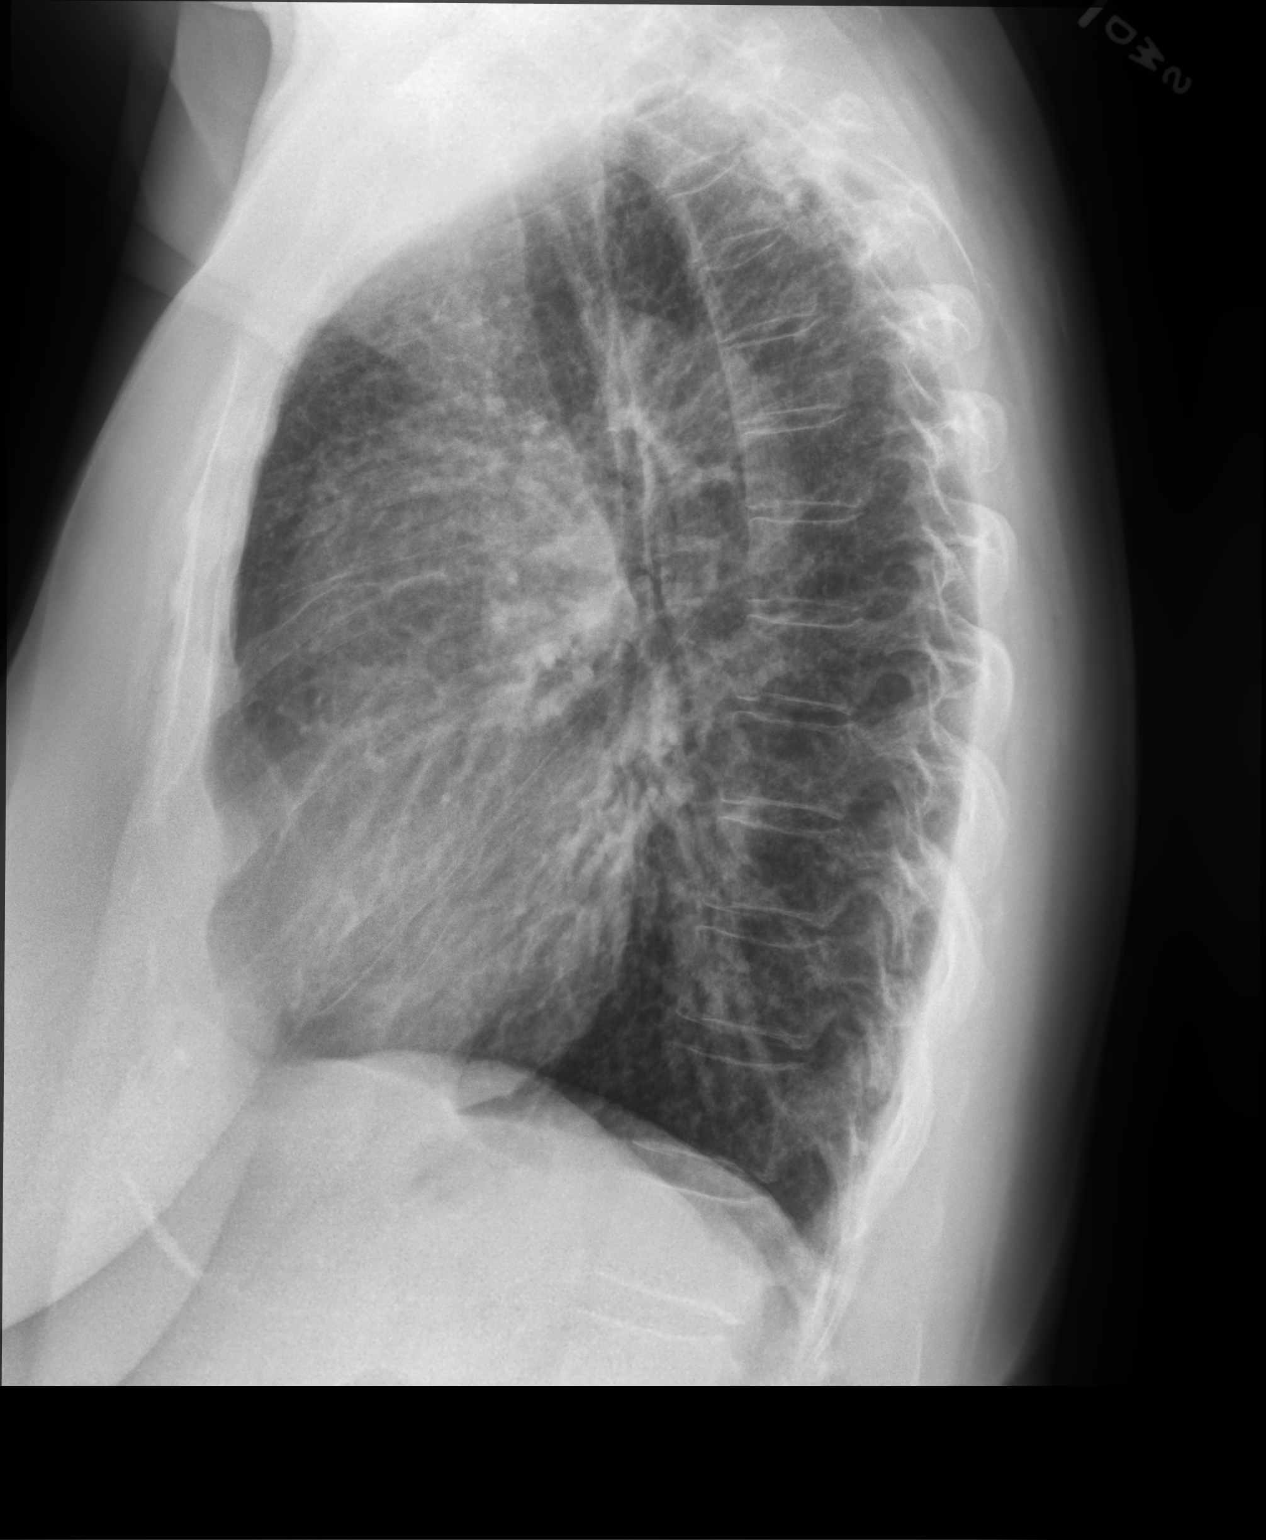

[2 of 2 positions shown; findings below may reference images not displayed]

FINDINGS: Chronic hyperinflation and bronchial thickening. Chronic biapical
pleuroparenchymal scarring. Previous right upper lobe airspace
disease has resolved. No acute consolidation. Stable heart size and
mediastinal contours. No pleural fluid or pneumothorax. No obvious
pulmonary nodule or mass. No acute osseous abnormalities are seen.
IMPRESSION: 1. Chronic hyperinflation and bronchial thickening, imaging findings
consistent with COPD.
2. No acute consolidation. Right upper lobe airspace disease has
resolved.

## 2021-05-13 ENCOUNTER — Encounter (HOSPITAL_COMMUNITY): Payer: Self-pay

## 2021-05-13 ENCOUNTER — Emergency Department (HOSPITAL_COMMUNITY)
Admission: EM | Admit: 2021-05-13 | Discharge: 2021-05-13 | Disposition: A | Discharge status: Home / Self Care | Payer: No Typology Code available for payment source | Attending: Emergency Medicine | Admitting: Emergency Medicine

## 2021-05-13 ENCOUNTER — Emergency Department (HOSPITAL_COMMUNITY): Payer: No Typology Code available for payment source

## 2021-05-13 ENCOUNTER — Other Ambulatory Visit: Payer: Self-pay

## 2021-05-13 DIAGNOSIS — R0789 Other chest pain: Secondary | ICD-10-CM | POA: Diagnosis not present

## 2021-05-13 DIAGNOSIS — Z8616 Personal history of COVID-19: Secondary | ICD-10-CM | POA: Diagnosis not present

## 2021-05-13 DIAGNOSIS — I5021 Acute systolic (congestive) heart failure: Secondary | ICD-10-CM | POA: Insufficient documentation

## 2021-05-13 DIAGNOSIS — F17200 Nicotine dependence, unspecified, uncomplicated: Secondary | ICD-10-CM | POA: Diagnosis not present

## 2021-05-13 DIAGNOSIS — R0602 Shortness of breath: Secondary | ICD-10-CM | POA: Insufficient documentation

## 2021-05-13 DIAGNOSIS — R001 Bradycardia, unspecified: Secondary | ICD-10-CM | POA: Insufficient documentation

## 2021-05-13 DIAGNOSIS — R059 Cough, unspecified: Secondary | ICD-10-CM | POA: Diagnosis not present

## 2021-05-13 DIAGNOSIS — R5383 Other fatigue: Secondary | ICD-10-CM | POA: Insufficient documentation

## 2021-05-13 LAB — TROPONIN I (HIGH SENSITIVITY)
Troponin I (High Sensitivity): 6 ng/L (ref <18)
Troponin I (High Sensitivity): 7 ng/L (ref <18)

## 2021-05-13 LAB — CBC WITH DIFFERENTIAL/PLATELET
Abs Immature Granulocytes: 0.04 10*3/uL (ref 0.00–0.07)
Basophils Absolute: 0 10*3/uL (ref 0.0–0.1)
Basophils Relative: 0 %
Eosinophils Absolute: 0 10*3/uL (ref 0.0–0.5)
Eosinophils Relative: 0 %
HCT: 45 % (ref 36.0–46.0)
Hemoglobin: 15 g/dL (ref 12.0–15.0)
Immature Granulocytes: 0 %
Lymphocytes Relative: 28 %
Lymphs Abs: 3 10*3/uL (ref 0.7–4.0)
MCH: 31.3 pg (ref 26.0–34.0)
MCHC: 33.3 g/dL (ref 30.0–36.0)
MCV: 93.8 fL (ref 80.0–100.0)
Monocytes Absolute: 0.7 10*3/uL (ref 0.1–1.0)
Monocytes Relative: 6 %
Neutro Abs: 7.2 10*3/uL (ref 1.7–7.7)
Neutrophils Relative %: 66 %
Platelets: 238 10*3/uL (ref 150–400)
RBC: 4.8 MIL/uL (ref 3.87–5.11)
RDW: 12.5 % (ref 11.5–15.5)
WBC: 11 10*3/uL — ABNORMAL HIGH (ref 4.0–10.5)
nRBC: 0 % (ref 0.0–0.2)

## 2021-05-13 LAB — BASIC METABOLIC PANEL
Anion gap: 11 (ref 5–15)
BUN: 14 mg/dL (ref 6–20)
CO2: 24 mmol/L (ref 22–32)
Calcium: 9.6 mg/dL (ref 8.9–10.3)
Chloride: 102 mmol/L (ref 98–111)
Creatinine, Ser: 0.66 mg/dL (ref 0.44–1.00)
GFR, Estimated: >60 mL/min (ref >60)
Glucose, Bld: 93 mg/dL (ref 70–99)
Potassium: 3.2 mmol/L — ABNORMAL LOW (ref 3.5–5.1)
Sodium: 137 mmol/L (ref 135–145)

## 2021-05-13 LAB — T4, FREE: Free T4: 0.56 ng/dL — ABNORMAL LOW (ref 0.61–1.12)

## 2021-05-13 LAB — TSH: TSH: 3.382 u[IU]/mL (ref 0.350–4.500)

## 2021-05-13 LAB — MAGNESIUM: Magnesium: 2 mg/dL (ref 1.7–2.4)

## 2021-05-13 LAB — BRAIN NATRIURETIC PEPTIDE: B Natriuretic Peptide: 53.8 pg/mL (ref 0.0–100.0)

## 2021-05-13 MED ORDER — POTASSIUM CHLORIDE CRYS ER 20 MEQ PO TBCR
40.0000 meq | EXTENDED_RELEASE_TABLET | Freq: Once | ORAL | Status: AC
  Administered 2021-05-13: 40 meq via ORAL
  Filled 2021-05-13: qty 2

## 2021-05-13 NOTE — ED Provider Notes (Signed)
[facility] EMERGENCY DEPARTMENT Provider Note   CSN: [id number] Arrival date & time: 05/13/21  0844     History Chief Complaint  Patient presents with   Shortness of Breath    [name] is a 60 y.o. female.  [name] is a 60 y.o. female with history of CHF, emphysema, tobacco use, Graves' disease, chronic back pain, who presents to the emergency department for evaluation of shortness of breath and fatigue.  Symptoms have been present and worsening over the last 3 days.  She felt like she overdid it over the weekend, but has become progressively more short of breath.  Reports she had a funny feeling some tightness in her chest yesterday but otherwise has not had associated chest pain.  No pleuritic pain. No hx of blood clots. Has not had any leg swelling.  Does report a history of CHF and this feels somewhat similar but she has not had any leg swelling like she typically does with heart failure exacerbations.  She has had an occasional cough but no fever or sick contacts.  Reports just feeling extremely fatigued, no energy to do the things she typically would.  The history is provided by the patient.      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   OB History   No obstetric history on file.     Family History  Problem Relation Age of  Onset   Thyroid disease Neg Hx     Social History   Tobacco Use   Smoking status: Every Day    Pack years: 0.00   Smokeless tobacco: Never  Substance Use Topics   Alcohol use: No   Drug use: No    Home Medications Prior to Admission medications   Medication Sig Start Date End Date Taking? Authorizing Provider  Acetaminophen-Caffeine 500-65 MG TABS Take 2 tablets by mouth daily as needed (pain).     [provider]  albuterol (VENTOLIN HFA) 108 (90 Base) MCG/ACT inhaler Inhale 2 puffs into the lungs every 4 (four) hours as needed for wheezing or shortness of breath. 02/13/21   Moshe Cipro, NP  cetirizine (ZYRTEC) 10 MG tablet Take 10 mg by mouth daily.    [provider]  methimazole (TAPAZOLE) 5 MG tablet Take 1 tablet (5 mg total) by mouth daily. 03/12/21   Romero Belling, MD    Allergies    Other  Review of Systems   Review of Systems  Constitutional:  Positive for fatigue. Negative for chills and fever.  HENT: Negative.    Respiratory:  Positive for cough and shortness of breath. Negative for chest tightness and wheezing.   Cardiovascular:  Negative for chest pain, palpitations and leg swelling.  Gastrointestinal:  Negative for nausea and vomiting.  Genitourinary:  Negative for dysuria.  Musculoskeletal:  Negative for arthralgias and myalgias.  Skin:  Negative for rash.  Neurological:  Negative for dizziness, syncope and light-headedness.  All other systems reviewed and are negative.  Physical Exam Updated Vital Signs BP (!) 168/69   Pulse (!) 50   Temp 98.1 F (36.7 C) (Oral)   Resp 19   SpO2 98%   Physical Exam Vitals and nursing note reviewed.  Constitutional:      General: She is not in acute distress.    Appearance: Normal appearance. She is well-developed. She is not ill-appearing or diaphoretic.  HENT:     Head: Normocephalic and atraumatic.     Mouth/Throat:     Mouth: Mucous membranes are moist.     Pharynx: Oropharynx is  clear.  Eyes:     General:        Right eye: No discharge.        Left eye: No discharge.  Cardiovascular:     Rate and Rhythm: Regular rhythm. Bradycardia present.     Pulses: Normal pulses.     Heart sounds: Normal heart sounds.     Comments: Bradycardia with regular rhythm noted, heart rates ranging in the 40s-50s Pulmonary:     Effort: Pulmonary effort is normal. No respiratory distress.     Breath sounds: Normal breath sounds. No wheezing or rales.     Comments: Respirations equal and unlabored, patient able to speak in full sentences, lungs clear to auscultation bilaterally  Chest:     Chest wall: No tenderness.  Abdominal:     General: Bowel sounds are normal. There is no distension.     Palpations: Abdomen is soft. There is no mass.     Tenderness: There is no abdominal tenderness. There is no guarding.     Comments: Abdomen soft, nondistended, nontender to palpation in all quadrants without guarding or peritoneal signs  Musculoskeletal:        General: No deformity.     Cervical back: Neck supple.     Right lower leg: No tenderness. No edema.     Left lower leg: No tenderness. No edema.  Skin:    General: Skin is warm and dry.     Capillary Refill: Capillary refill takes less than 2 seconds.  Neurological:     Mental Status: She is alert and oriented to person, place, and time.     Coordination: Coordination normal.     Comments: Speech is clear, able to follow commands Moves extremities without ataxia, coordination intact  Psychiatric:        Mood and Affect: Mood normal.        Behavior: Behavior normal.    ED Results / Procedures / Treatments   Labs (all labs ordered are listed, but only abnormal results are displayed) Labs Reviewed  BASIC METABOLIC PANEL - Abnormal; Notable for the following components:      Result Value   Potassium 3.2 (*)    All other components within normal limits  CBC WITH DIFFERENTIAL/PLATELET - Abnormal; Notable for the following  components:   WBC 11.0 (*)    All other components within normal limits  T4, FREE - Abnormal; Notable for the following components:   Free T4 0.56 (*)    All other components within normal limits  BRAIN NATRIURETIC PEPTIDE  TSH  MAGNESIUM  TROPONIN I (HIGH SENSITIVITY)  TROPONIN I (HIGH SENSITIVITY)    EKG EKG Interpretation  Date/Time:  Tuesday May 13 2021 08:48:10 EDT Ventricular Rate:  61 PR Interval:  162 QRS Duration: 84 QT Interval:  402 QTC Calculation: 404 R Axis:   94 Text Interpretation: Unusual P axis, possible ectopic atrial rhythm Rightward axis Abnormal ECG Confirmed by Tilden Fossa [phone]) on 05/14/2021 7:27:28 PM  Radiology DG Chest 2 View  Result Date: 05/13/2021 CLINICAL DATA:  Shortness of breath for several days EXAM: CHEST - 2 VIEW COMPARISON:  02/13/2021 FINDINGS: Cardiac shadow is within normal limits. Aortic calcifications are noted. The lungs are well aerated bilaterally without focal infiltrate or sizable effusion. Apical scarring is again noted bilaterally. No bony abnormality is seen. IMPRESSION: No acute abnormality noted. Electronically Signed   By: Alcide Clever M.D.   On: 05/13/2021 09:45    Procedures Procedures   Medications Ordered in ED Medications  potassium chloride SA (KLOR-CON) CR tablet 40 mEq (40 mEq Oral Given 05/13/21 1642)    ED Course  I have reviewed the triage vital signs and the nursing notes.  Pertinent labs & imaging results that were available during my care of the patient were reviewed by me and considered in my medical decision making (see chart for details).    MDM Rules/Calculators/A&P                         60 y.o. female presents to the ED with complaints of shortness of breath and fatigue, this involves an extensive number of treatment options, and is a complaint that carries with it a high risk of complications and morbidity.  The differential diagnosis includes CHF exacerbation, COPD exacerbation, pneumonia,  COVID, ACS, arrhythmia, PE, anemia   On arrival pt is nontoxic, vitals significant for bradycardia with heart rates ranging in the 40s-50s, on review of patient's prior visits heart rate usually ranges in the 60s-70s, hypertensive, otherwise vitals normal. Exam significant for clear lungs without rales, wheezing or diminished breath sounds to suggest fluid overload or COPD, no lower extremity edema noted.  Additional history obtained from chart review. Previous records obtained and reviewed via EMR  Lab Tests:  I Ordered, reviewed, and interpreted labs, which included:  CBC: Minimal leukocytosis, normal hemoglobin BMP: Mild hypokalemia of 3.2, p.o. potassium replacement given, no other electrolyte derangements, normal renal function Troponin: Negative x2 BNP: WNL, not suggestive of CHF exacerbation Magnesium: WNL TSH: Within normal limits, free T4, slightly low at 0.56  Imaging Studies ordered:  I ordered imaging studies which included chest x-ray, I independently visualized and interpreted imaging which showed no acute cardiopulmonary disease  ED Course:   Work-up today has overall been reassuring.  Patient found to have new bradycardia, which could be contributing to her shortness of breath and fatigue, not on any beta-blockers.  Does have history of hyperthyroidism, but TSH levels are appropriate.  We will have her follow closely with her endocrinologist and also referred to cardiology as patient likely needs an outpatient echo, but she does not have increased work of breathing and is ambulatory here in the ED do not feel she will need admission for further emergent evaluation.  Discussed close outpatient follow-up and strict return precautions.  Patient expresses understanding and agreement with plan.  Discharged home in good condition.  Portions of this note were generated with Scientist, clinical (histocompatibility and immunogenetics). Dictation errors may occur despite best attempts at proofreading.   Final  Clinical Impression(s) / ED Diagnoses Final diagnoses:  None    Rx / DC Orders ED Discharge Orders     None        Dartha Lodge, [place] 05/16/21 1256  Gerhard MunchLockwood, Robert, MD 05/19/21 [phone]

## 2021-05-13 NOTE — Discharge Instructions (Addendum)
Your evaluation today has overall been reassuring, no signs of fluid on your lungs, or pneumonia.  Your thyroid levels are within normal limits today.    Your heart rate was noted to be persistently slower than usual today ranging in the 40s-50s, this could potentially be contributing to your fatigue and shortness of breath.  I have referred you to cardiology for further evaluation of this, if you do not hear from them within the next week please call to schedule follow-up appointment.  I would also like for you to follow-up with your endocrinologist.  If you develop worsening shortness of breath, chest pains, feel lightheaded or like you are going to pass out or develop any other new or concerning symptoms please return for reevaluation.

## 2021-05-13 NOTE — ED Triage Notes (Signed)
Pt presents with SOB x3 days, worsens w/exertion, no energy that worsened yesterday. Pt denies CP. Hx of CHF and Graves dx

## 2021-05-13 NOTE — ED Provider Notes (Signed)
Emergency Medicine Provider Triage Evaluation Note  [name] , a 60 y.o. female  was evaluated in triage.  Pt complains of shortness of breath and fatigue.  Symptoms have been present and worsening over the last 3 days.  She felt like she overdid it over the weekend, but has become progressively more short of breath.  Reports she had a funny feeling some tightness in her chest yesterday but otherwise has not had associated chest pain.  Has not had any leg swelling.  Does report a history of CHF and this feels somewhat similar.  She has had an occasional cough but no fever or sick contacts.  Reports just feeling extremely fatigued, no energy to do the things she typically would.  Review of Systems  Positive: Shortness of breath, fatigue Negative: Fever, chest pain  Physical Exam  BP (!) 154/90 (BP Location: Right Arm)   Pulse 66   Temp 98.7 F (37.1 C)   Resp (!) 28   SpO2 100%  Gen:   Awake, no distress   Resp:  Patient is mildly tachypneic, without accessory muscle use, some faint crackles in bases, no wheezes or rhonchi MSK:   Moves extremities without difficulty  Other:    Medical Decision Making  Medically screening exam initiated at 9:17 AM.  Appropriate orders placed.  [name] was informed that the remainder of the evaluation will be completed by another provider, this initial triage assessment does not replace that evaluation, and the importance of remaining in the ED until their evaluation is complete.     Dartha Lodge, PA-C 05/13/21 4401    Gerhard Munch, MD 05/14/21 [phone]

## 2021-06-20 ENCOUNTER — Encounter: Payer: Self-pay | Admitting: Cardiology

## 2021-06-20 ENCOUNTER — Ambulatory Visit (INDEPENDENT_AMBULATORY_CARE_PROVIDER_SITE_OTHER): Payer: No Typology Code available for payment source | Admitting: Cardiology

## 2021-06-20 ENCOUNTER — Ambulatory Visit (INDEPENDENT_AMBULATORY_CARE_PROVIDER_SITE_OTHER): Payer: No Typology Code available for payment source

## 2021-06-20 ENCOUNTER — Other Ambulatory Visit: Payer: Self-pay

## 2021-06-20 VITALS — BP 140/78 | HR 61 | Ht 63.5 in | Wt 162.0 lb

## 2021-06-20 DIAGNOSIS — F17200 Nicotine dependence, unspecified, uncomplicated: Secondary | ICD-10-CM | POA: Diagnosis not present

## 2021-06-20 DIAGNOSIS — R0600 Dyspnea, unspecified: Secondary | ICD-10-CM

## 2021-06-20 DIAGNOSIS — R0609 Other forms of dyspnea: Secondary | ICD-10-CM

## 2021-06-20 DIAGNOSIS — J431 Panlobular emphysema: Secondary | ICD-10-CM

## 2021-06-20 DIAGNOSIS — I272 Pulmonary hypertension, unspecified: Secondary | ICD-10-CM | POA: Diagnosis not present

## 2021-06-20 DIAGNOSIS — R001 Bradycardia, unspecified: Secondary | ICD-10-CM | POA: Diagnosis not present

## 2021-06-20 NOTE — Progress Notes (Signed)
Shortness  Cardiology Consultation:    Date:  06/20/2021   ID:  [name], DOB 1961-03-24, MRN [id number]  PCP:  Dimple Nanas, MD  Cardiologist:  Gypsy Balsam, MD   Referring MD: Dartha Lodge, PA-C   Chief Complaint  Patient presents with   Shortness of Breath    Couple months ongoing     Fatigue  Shortness of breath  History of Present Illness:    [name] is a 60 y.o. female who is being seen today for the evaluation of breath at the request of Dartha Lodge, PA-C.  With past medical history significant for congestive heart failure, she is a chronic smoker does have COPD, Graves' disease, recently she end up going to the emergency room because of shortness of breath.  Quite extensive evaluation has been done, troponins were negative, proBNP was normal she was asked to follow-up with [place] urgently with diagnosis of congestive heart failure.  Apparently this diagnosis has been established in 2018.  I was able to review echocardiogram from the time and at that time she was find to have moderate to gasp regurgitation with pulmonary hypertension of 60 mmHg.  However no future work-up done after that.  She describes shortness of leg is slightly better compared to last time when she was in the emergency room but still present.  She works from home.  She is not able to walk much because of shortness of breath.  There is no swelling of lower extremities, there is no proximal nocturnal dyspnea.  She denies have any palpitations.  No chest pain tightness squeezing pressure burning chest.  Overall however she complained of being exhausted. She does have multiple family members with multiple heart conditions including premature coronary artery disease as well as arrhythmias including atrial fibrillation. She still continues to smoke she understands the problem trying to work on quitting smoking. She does not exercise on the regular basis she is not on any diet. I did review record  from the emergency room for this visit.  Past Medical History:  Diagnosis Date   CHF (congestive heart failure) (HCC)    Graves disease    Herniated disc     Past Surgical History:  Procedure Laterality Date   back L4-5 herniation repair     BUNIONECTOMY     CARPAL TUNNEL RELEASE Right    CATARACT EXTRACTION, BILATERAL     plate and screws of R foot/arm      Current Medications: Current Meds  Medication Sig   Acetaminophen-Caffeine 500-65 MG TABS Take 2 tablets by mouth daily as needed (pain).    albuterol (VENTOLIN HFA) 108 (90 Base) MCG/ACT inhaler Inhale 2 puffs into the lungs every 4 (four) hours as needed for wheezing or shortness of breath.   cetirizine (ZYRTEC) 10 MG tablet Take 10 mg by mouth daily.   methimazole (TAPAZOLE) 5 MG tablet Take 1 tablet (5 mg total) by mouth daily.   omeprazole (PRILOSEC OTC) 20 MG tablet Take 20 mg by mouth daily.     Allergies:   Other   Social History   Socioeconomic History   Marital status: Single    Spouse name: Not on file   Number of children: Not on file   Years of education: Not on file   Highest education level: Not on file  Occupational History   Not on file  Tobacco Use   Smoking status: Every Day   Smokeless tobacco: Never  Substance and Sexual  Activity   Alcohol use: No   Drug use: No   Sexual activity: Not on file  Other Topics Concern   Not on file  Social History Narrative   Not on file   Social Determinants of Health   Financial Resource Strain: Not on file  Food Insecurity: Not on file  Transportation Needs: Not on file  Physical Activity: Not on file  Stress: Not on file  Social Connections: Not on file     Family History: The patient's family history is negative for Thyroid disease. ROS:   Please see the history of present illness.    All 14 point review of systems negative except as described per history of present illness.  EKGs/Labs/Other Studies Reviewed:    The following studies were  reviewed today: Echocardiogram reviewed from 2018, showing enlargement of the atrium with normal ejection fraction but moderate tricuspid regurgitation with pulmonary hypertension  EKG:  EKG is  ordered today.  The ekg ordered today demonstrates normal sinus rhythm rate of 60, normal P interval normal QS complex duration morphology no ST segment changes  Recent Labs: 05/13/2021: B Natriuretic Peptide 53.8; BUN 14; Creatinine, Ser 0.66; Hemoglobin 15.0; Magnesium 2.0; Platelets 238; Potassium 3.2; Sodium 137; TSH 3.382  Recent Lipid Panel    Component Value Date/Time   CHOL 68 02/23/2017 1834   TRIG 75 02/23/2017 1834   HDL 22 (L) 02/23/2017 1834   CHOLHDL 3.1 02/23/2017 1834   VLDL 15 02/23/2017 1834   LDLCALC 31 02/23/2017 1834    Physical Exam:    VS:  BP 140/78 (BP Location: Right Arm, Patient Position: Sitting)   Pulse 61   Ht 5' 3.5" (1.613 m)   Wt 162 lb (73.5 kg)   SpO2 96%   BMI 28.25 kg/m     Wt Readings from Last 3 Encounters:  06/20/21 162 lb (73.5 kg)  03/12/21 163 lb 3.2 oz (74 kg)  09/12/20 158 lb (71.7 kg)     GEN:  Well nourished, well developed in no acute distress HEENT: Normal NECK: No JVD; No carotid bruits LYMPHATICS: No lymphadenopathy CARDIAC: RRR, systolic murmur grade 2/6 best heard left border of the sternum, no rubs, no gallops RESPIRATORY:  Clear to auscultation without rales, wheezing or rhonchi poor air entry bilaterally ABDOMEN: Soft, non-tender, non-distended MUSCULOSKELETAL:  No edema; No deformity  SKIN: Warm and dry NEUROLOGIC:  Alert and oriented x 3 PSYCHIATRIC:  Normal affect   ASSESSMENT:    1. Bradycardia   2. Dyspnea on exertion   3. Pulmonary hypertension, unspecified (HCC) 60 mmHg based on echocardiogram from 2018   4. Tobacco use disorder   5. Panlobular emphysema (HCC)   6. Sinus bradycardia    PLAN:    In order of problems listed above:  Bradycardia which is surprising finding even when she was in the emergency  room with distress and she was bradycardic.  Mechanism is sinus.  I will ask her to wear a monitor to see what the chronotropic response is and how slow her heart rate goes of course we will avoid any sinus node suppressing agent. Dyspnea on exertion.  On the physical exam I do see some JVD however her legs are not swollen her physical exam showing clear lung fields but poor air entry.  Of course alarming is the fact that she did have diagnosis of pulmonary hypertension in 2018.  Echocardiogram will be done quickly trying to establish diagnosis and initiate treatment if needed.  She may require  at left and the right cardiac catheterization. Pulmonary hypertension initially diagnosed in 2019.  Plan as outlined above. Smoking obviously huge problem.  She does have already COPD.  She understands she need to quit as she needs to work on that.  If she truly got pulmonary hypertension of 60 mmHg that is usually not to the level that we are achieving with chest COPD/smoking.  Of course one of the differential diagnosis of pulmonary hypertension will be rule out chronic PE.  She said her brother got history of DVT therefore she may have recurrent PE. I will see her back rather quickly echocardiogram will be done, she will put Zio patch on her and I see her back within the next few weeks   Medication Adjustments/Labs and Tests Ordered: Current medicines are reviewed at length with the patient today.  Concerns regarding medicines are outlined above.  Orders Placed This Encounter  Procedures   LONG TERM MONITOR (3-14 DAYS)   EKG 12-Lead   ECHOCARDIOGRAM COMPLETE   No orders of the defined types were placed in this encounter.   Signed, Georgeanna Lea, MD, [facility]. 06/20/2021 12:07 PM    [facility]

## 2021-06-20 NOTE — Patient Instructions (Signed)
Medication Instructions:  Your physician recommends that you continue on your current medications as directed. Please refer to the Current Medication list given to you today.  *If you need a refill on your cardiac medications before your next appointment, please call your pharmacy*   Lab Work: None If you have labs (blood work) drawn today and your tests are completely normal, you will receive your results only by: MyChart Message (if you have MyChart) OR A paper copy in the mail If you have any lab test that is abnormal or we need to change your treatment, we will call you to review the results.   Testing/Procedures: Your physician has requested that you have an echocardiogram. Echocardiography is a painless test that uses sound waves to create images of your heart. It provides your doctor with information about the size and shape of your heart and how well your heart's chambers and valves are working. This procedure takes approximately one hour. There are no restrictions for this procedure.  A zio monitor was ordered today. It will remain on for 7 days. You will then return monitor and event diary in provided box. It takes 1-2 weeks for report to be downloaded and returned to [place]. We will call you with the results. If monitor falls off or has orange flashing light, please call Zio for further instructions.     Follow-Up: At [facility], you and your health needs are our priority.  As part of our continuing mission to provide you with exceptional heart care, we have created designated Provider Care Teams.  These Care Teams include your primary Cardiologist (physician) and Advanced Practice Providers (APPs -  Physician Assistants and Nurse Practitioners) who all work together to provide you with the care you need, when you need it.  We recommend signing up for the patient portal called "MyChart".  Sign up information is provided on this After Visit Summary.  MyChart is used to connect with  patients for Virtual Visits (Telemedicine).  Patients are able to view lab/test results, encounter notes, upcoming appointments, etc.  Non-urgent messages can be sent to your provider as well.   To learn more about what you can do with MyChart, go to [email or web address].    Your next appointment:   1 month(s)  The format for your next appointment:   In Person  Provider:   Gypsy Balsam, MD   Other Instructions ZIO  WHY IS MY DOCTOR PRESCRIBING ZIO? The Zio system is proven and trusted by physicians to detect and diagnose irregular heart rhythms -- and has been prescribed to hundreds of thousands of patients.  The FDA has cleared the Zio system to monitor for many different kinds of irregular heart rhythms. In a study, physicians were able to reach a diagnosis 90% of the time with the Zio system1.  You can wear the Zio monitor -- a small, discreet, comfortable patch -- during your normal day-to-day activity, including while you sleep, shower, and exercise, while it records every single heartbeat for analysis.  1Barrett, P., et al. Comparison of 24 Hour Holter Monitoring Versus 14 Day Novel Adhesive Patch Electrocardiographic Monitoring. American Journal of Medicine, 2014.  ZIO VS. HOLTER MONITORING The Zio monitor can be comfortably worn for up to 14 days. Holter monitors can be worn for 24 to 48 hours, limiting the time to record any irregular heart rhythms you may have. Zio is able to capture data for the 51% of patients who have their first symptom-triggered arrhythmia after 48 hours.1  LIVE WITHOUT RESTRICTIONS The Zio ambulatory cardiac monitor is a small, unobtrusive, and water-resistant patch--you might even forget you're wearing it. The Zio monitor records and stores every beat of your heart, whether you're sleeping, working out, or showering. Remove on: July 29th 2022  Echocardiogram An echocardiogram is a test that uses sound waves (ultrasound) to produce images of  the heart. Images from an echocardiogram can provide important information about: Heart size and shape. The size and thickness and movement of your heart's walls. Heart muscle function and strength. Heart valve function or if you have stenosis. Stenosis is when the heart valves are too narrow. If blood is flowing backward through the heart valves (regurgitation). A tumor or infectious growth around the heart valves. Areas of heart muscle that are not working well because of poor blood flow or injury from a heart attack. Aneurysm detection. An aneurysm is a weak or damaged part of an artery wall. The wall bulges out from the normal force of blood pumping through the body. Tell a health care provider about: Any allergies you have. All medicines you are taking, including vitamins, herbs, eye drops, creams, and over-the-counter medicines. Any blood disorders you have. Any surgeries you have had. Any medical conditions you have. Whether you are pregnant or may be pregnant. What are the risks? Generally, this is a safe test. However, problems may occur, including an allergic reaction to dye (contrast) that may be used during the test. What happens before the test? No specific preparation is needed. You may eat and drink normally. What happens during the test?  You will take off your clothes from the waist up and put on a hospital gown. Electrodes or electrocardiogram (ECG)patches may be placed on your chest. The electrodes or patches are then connected to a device that monitors your heart rate and rhythm. You will lie down on a table for an ultrasound exam. A gel will be applied to your chest to help sound waves pass through your skin. A handheld device, called a transducer, will be pressed against your chest and moved over your heart. The transducer produces sound waves that travel to your heart and bounce back (or "echo" back) to the transducer. These sound waves will be captured in real-time  and changed into images of your heart that can be viewed on a video monitor. The images will be recorded on a computer and reviewed by your health care provider. You may be asked to change positions or hold your breath for a short time. This makes it easier to get different views or better views of your heart. In some cases, you may receive contrast through an IV in one of your veins. This can improve the quality of the pictures from your heart. The procedure may vary among health care providers and hospitals. What can I expect after the test? You may return to your normal, everyday life, including diet, activities, andmedicines, unless your health care provider tells you not to do that. Follow these instructions at home: It is up to you to get the results of your test. Ask your health care provider, or the department that is doing the test, when your results will be ready. Keep all follow-up visits. This is important. Summary An echocardiogram is a test that uses sound waves (ultrasound) to produce images of the heart. Images from an echocardiogram can provide important information about the size and shape of your heart, heart muscle function, heart valve function, and other possible heart problems. You  do not need to do anything to prepare before this test. You may eat and drink normally. After the echocardiogram is completed, you may return to your normal, everyday life, unless your health care provider tells you not to do that. This information is not intended to replace advice given to you by your health care provider. Make sure you discuss any questions you have with your healthcare provider. Document Revised: 07/09/2020 Document Reviewed: 07/09/2020 Elsevier Patient Education  2022 [employer].

## 2021-07-04 ENCOUNTER — Other Ambulatory Visit: Payer: Self-pay

## 2021-07-04 ENCOUNTER — Ambulatory Visit (HOSPITAL_COMMUNITY): Payer: No Typology Code available for payment source | Attending: Cardiovascular Disease

## 2021-07-04 DIAGNOSIS — R001 Bradycardia, unspecified: Secondary | ICD-10-CM | POA: Diagnosis present

## 2021-07-04 LAB — ECHOCARDIOGRAM COMPLETE
Area-P 1/2: 2.26 cm2
S' Lateral: 3.4 cm

## 2021-07-22 DIAGNOSIS — I509 Heart failure, unspecified: Secondary | ICD-10-CM | POA: Insufficient documentation

## 2021-07-23 ENCOUNTER — Encounter: Payer: Self-pay | Admitting: Cardiology

## 2021-07-23 ENCOUNTER — Other Ambulatory Visit: Payer: Self-pay

## 2021-07-23 ENCOUNTER — Ambulatory Visit (INDEPENDENT_AMBULATORY_CARE_PROVIDER_SITE_OTHER): Payer: No Typology Code available for payment source | Admitting: Cardiology

## 2021-07-23 VITALS — BP 128/76 | HR 57 | Ht 64.0 in | Wt 165.0 lb

## 2021-07-23 DIAGNOSIS — R001 Bradycardia, unspecified: Secondary | ICD-10-CM

## 2021-07-23 DIAGNOSIS — I272 Pulmonary hypertension, unspecified: Secondary | ICD-10-CM

## 2021-07-23 DIAGNOSIS — R0609 Other forms of dyspnea: Secondary | ICD-10-CM

## 2021-07-23 DIAGNOSIS — J449 Chronic obstructive pulmonary disease, unspecified: Secondary | ICD-10-CM

## 2021-07-23 DIAGNOSIS — F17200 Nicotine dependence, unspecified, uncomplicated: Secondary | ICD-10-CM | POA: Diagnosis not present

## 2021-07-23 DIAGNOSIS — J431 Panlobular emphysema: Secondary | ICD-10-CM

## 2021-07-23 DIAGNOSIS — Z13220 Encounter for screening for lipoid disorders: Secondary | ICD-10-CM

## 2021-07-23 DIAGNOSIS — R0600 Dyspnea, unspecified: Secondary | ICD-10-CM | POA: Diagnosis not present

## 2021-07-23 NOTE — Progress Notes (Signed)
Cardiology Office Note:    Date:  07/23/2021   ID:  [name], DOB Feb 13, 1961, MRN [id number]  PCP:  Dimple Nanas, MD  Cardiologist:  Gypsy Balsam, MD    Referring MD: Dimple Nanas, MD   Chief Complaint  Patient presents with   Results    History of Present Illness:    [name] is a 60 y.o. female who is a chronic smoker.  She was evaluated by me for potential congestive heart failure.  She was sent to the emergency room she was having shortness of breath however proBNP was normal.  She also got COPD, Graves' disease.  I did echocardiogram on her which showed normal left ventricle ejection fraction, normal left atrial size, transmitral flow pattern was characteristic for relaxation abnormality which indicates normal pulm artery wedge pressure of the Doppler study.  I did also review her echocardiogram from before which showed pulmonary hypertension of 60 mmHg however present echocardiogram did not confirm that.  Her pulmonary pressure was normal I did review echocardiogram there is no right ventricle enlargement there is no significant tricuspid regurgitation.  Another concern was sinus bradycardia.  I put monitor on her which did not show significant bradycardia.  Overall looks like she does not have any significant cardiac problem.  Overall she says she is doing better but still some shortness of breath fatigue tiredness.  She also is aware of her heart speeding up.  Past Medical History:  Diagnosis Date   CHF (congestive heart failure) (HCC)    Graves disease    Herniated disc     Past Surgical History:  Procedure Laterality Date   back L4-5 herniation repair     BUNIONECTOMY     CARPAL TUNNEL RELEASE Right    CATARACT EXTRACTION, BILATERAL     plate and screws of R foot/arm      Current Medications: Current Meds  Medication Sig   Acetaminophen-Caffeine 500-65 MG TABS Take 2 tablets by mouth daily as needed (pain).    albuterol (VENTOLIN HFA) 108 (90  Base) MCG/ACT inhaler Inhale 2 puffs into the lungs every 4 (four) hours as needed for wheezing or shortness of breath.   cetirizine (ZYRTEC) 10 MG tablet Take 10 mg by mouth daily.   methimazole (TAPAZOLE) 5 MG tablet Take 1 tablet (5 mg total) by mouth daily.   omeprazole (PRILOSEC OTC) 20 MG tablet Take 20 mg by mouth daily.     Allergies:   Other   Social History   Socioeconomic History   Marital status: Single    Spouse name: Not on file   Number of children: Not on file   Years of education: Not on file   Highest education level: Not on file  Occupational History   Not on file  Tobacco Use   Smoking status: Every Day   Smokeless tobacco: Never  Substance and Sexual Activity   Alcohol use: No   Drug use: No   Sexual activity: Not on file  Other Topics Concern   Not on file  Social History Narrative   Not on file   Social Determinants of Health   Financial Resource Strain: Not on file  Food Insecurity: Not on file  Transportation Needs: Not on file  Physical Activity: Not on file  Stress: Not on file  Social Connections: Not on file     Family History: The patient's family history is negative for Thyroid disease. ROS:   Please see the history of  present illness.    All 14 point review of systems negative except as described per history of present illness  EKGs/Labs/Other Studies Reviewed:      Recent Labs: 05/13/2021: B Natriuretic Peptide 53.8; BUN 14; Creatinine, Ser 0.66; Hemoglobin 15.0; Magnesium 2.0; Platelets 238; Potassium 3.2; Sodium 137; TSH 3.382  Recent Lipid Panel    Component Value Date/Time   CHOL 68 02/23/2017 1834   TRIG 75 02/23/2017 1834   HDL 22 (L) 02/23/2017 1834   CHOLHDL 3.1 02/23/2017 1834   VLDL 15 02/23/2017 1834   LDLCALC 31 02/23/2017 1834    Physical Exam:    VS:  BP 128/76 (BP Location: Right Arm, Patient Position: Sitting)   Pulse (!) 57   Ht 5\' 4"  (1.626 m)   Wt 165 lb (74.8 kg)   SpO2 97%   BMI 28.32 kg/m      Wt Readings from Last 3 Encounters:  07/23/21 165 lb (74.8 kg)  06/20/21 162 lb (73.5 kg)  03/12/21 163 lb 3.2 oz (74 kg)     GEN:  Well nourished, well developed in no acute distress HEENT: Normal NECK: No JVD; No carotid bruits LYMPHATICS: No lymphadenopathy CARDIAC: RRR, no murmurs, no rubs, no gallops RESPIRATORY: Poor effort bilaterally.  Clear to auscultation without rales, wheezing or rhonchi  ABDOMEN: Soft, non-tender, non-distended MUSCULOSKELETAL:  No edema; No deformity  SKIN: Warm and dry LOWER EXTREMITIES: no swelling NEUROLOGIC:  Alert and oriented x 3 PSYCHIATRIC:  Normal affect   ASSESSMENT:    1. Pulmonary hypertension, unspecified (HCC) 60 mmHg based on echocardiogram from 2018   2. Dyspnea on exertion   3. Tobacco use disorder   4. Panlobular emphysema (HCC)   5. Sinus bradycardia    PLAN:    In order of problems listed above:  Pulmonary hypertension however echocardiogram done now did not confirm that.  There is no right ventricular enlargement no significant tricuspid regurgitation normal IVC.  I doubt very much she does have significant pulmonary hypertension. Questionable congestive heart failure.  Ejection fraction preserved as mitral flow pattern corrected sick for relaxation abnormality which indicate normal pulm artery pressure at the time of examination.  I do not think she has congestive heart failure.  I suspect her dyspnea on exertion is related to lung condition which is related to chronic smoking.  I think she can benefit from seeing pulmonary.  CT of the chest need to be done and then she will require pulmonary function test. History of hyperthyroidism she is on methimazole.  Last TSH was normal check in June however she does have some signs and symptoms of hypothyroidism, therefore I will recheck TSH again. Smoking obviously huge problem I spent at least 5 minutes talking about need to quit she understands she will try to do it.  Cholesterol  status unknown: We will schedule her to have fasting lipid profile today.   Medication Adjustments/Labs and Tests Ordered: Current medicines are reviewed at length with the patient today.  Concerns regarding medicines are outlined above.  No orders of the defined types were placed in this encounter.  Medication changes: No orders of the defined types were placed in this encounter.   Signed, July, MD, [facility] 07/23/2021 9:20 AM    [facility]

## 2021-07-23 NOTE — Patient Instructions (Signed)
Medication Instructions:  Your physician recommends that you continue on your current medications as directed. Please refer to the Current Medication list given to you today.  *If you need a refill on your cardiac medications before your next appointment, please call your pharmacy*   Lab Work: Your physician recommends that you return for lab work in:  TODAY: Lipids If you have labs (blood work) drawn today and your tests are completely normal, you will receive your results only by: MyChart Message (if you have MyChart) OR A paper copy in the mail If you have any lab test that is abnormal or we need to change your treatment, we will call you to review the results.   Testing/Procedures: None   Follow-Up: At [facility], you and your health needs are our priority.  As part of our continuing mission to provide you with exceptional heart care, we have created designated Provider Care Teams.  These Care Teams include your primary Cardiologist (physician) and Advanced Practice Providers (APPs -  Physician Assistants and Nurse Practitioners) who all work together to provide you with the care you need, when you need it.  We recommend signing up for the patient portal called "MyChart".  Sign up information is provided on this After Visit Summary.  MyChart is used to connect with patients for Virtual Visits (Telemedicine).  Patients are able to view lab/test results, encounter notes, upcoming appointments, etc.  Non-urgent messages can be sent to your provider as well.   To learn more about what you can do with MyChart, go to [email or web address].    Your next appointment:   4 month(s)  The format for your next appointment:   In Person  Provider:   Gypsy Balsam, MD   Other Instructions

## 2021-07-24 LAB — LIPID PANEL
Chol/HDL Ratio: 4.5 ratio — ABNORMAL HIGH (ref 0.0–4.4)
Cholesterol, Total: 201 mg/dL — ABNORMAL HIGH (ref 100–199)
HDL: 45 mg/dL (ref >39)
LDL Chol Calc (NIH): 134 mg/dL — ABNORMAL HIGH (ref 0–99)
Triglycerides: 121 mg/dL (ref 0–149)
VLDL Cholesterol Cal: 22 mg/dL (ref 5–40)

## 2021-07-28 ENCOUNTER — Telehealth: Payer: Self-pay | Admitting: Cardiology

## 2021-07-28 DIAGNOSIS — I7090 Unspecified atherosclerosis: Secondary | ICD-10-CM

## 2021-07-28 DIAGNOSIS — E785 Hyperlipidemia, unspecified: Secondary | ICD-10-CM

## 2021-07-28 NOTE — Telephone Encounter (Signed)
Left message on patients voicemail to please return our call.   

## 2021-07-28 NOTE — Telephone Encounter (Signed)
Follow Up:      Patient says she was returning a call from her My Chart message.  She did not who it was, but was told to call back about her Lipitor.

## 2021-07-29 MED ORDER — ATORVASTATIN CALCIUM 10 MG PO TABS: 10.0000 mg | ORAL_TABLET | Freq: Every day | ORAL | 3 refills | Status: DC

## 2021-07-29 NOTE — Addendum Note (Signed)
Addended by: Delorse Limber I on: 07/29/2021 08:21 AM   Modules accepted: Orders

## 2021-07-29 NOTE — Telephone Encounter (Signed)
Spoke with patient regarding results and recommendation.  Patient verbalizes understanding and is agreeable to plan of care. Advised patient to call back with any issues or concerns.  

## 2021-08-25 ENCOUNTER — Ambulatory Visit (INDEPENDENT_AMBULATORY_CARE_PROVIDER_SITE_OTHER): Payer: No Typology Code available for payment source | Admitting: Pulmonary Disease

## 2021-08-25 ENCOUNTER — Other Ambulatory Visit: Payer: Self-pay

## 2021-08-25 ENCOUNTER — Encounter: Payer: Self-pay | Admitting: Pulmonary Disease

## 2021-08-25 VITALS — BP 118/70 | HR 62 | Temp 98.1°F | Ht 64.0 in | Wt 168.8 lb

## 2021-08-25 DIAGNOSIS — J439 Emphysema, unspecified: Secondary | ICD-10-CM

## 2021-08-25 NOTE — Patient Instructions (Signed)
Obstructive lung disease Shortness of breath  Previous CT scan showing emphysema  Obtain CT scan of the chest without contrast  Obtain pulmonary function test  Alpha-1 antitrypsin level and phenotype  Encourage smoking cessation  Follow-up in 6 to 8 weeks

## 2021-08-25 NOTE — Progress Notes (Signed)
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    albuterol (VENTOLIN HFA) 108 (90 Base) MCG/ACT inhaler Inhale 2 puffs into the lungs every 4 (four) hours as needed for wheezing or shortness of breath.   atorvastatin (LIPITOR) 10 MG tablet Take 1 tablet (10 mg total) by mouth daily.   cetirizine (ZYRTEC) 10 MG tablet Take 10 mg by mouth daily.   methimazole (TAPAZOLE) 5 MG tablet Take 1 tablet (5 mg total) by mouth daily.   omeprazole (PRILOSEC OTC) 20 MG tablet Take 20 mg by mouth daily.   No facility-administered encounter medications on file as of 08/25/2021.    Allergies as of 08/25/2021 - Review Complete 08/25/2021  Allergen Reaction Noted   Other Rash 02/23/2017    Past Medical History:  Diagnosis Date   CHF (congestive heart failure) (HCC)    Graves disease    Herniated disc     Past Surgical History:  Procedure Laterality Date   back L4-5  herniation repair     BUNIONECTOMY     CARPAL TUNNEL RELEASE Right    CATARACT EXTRACTION, BILATERAL     plate and screws of R foot/arm      Family History  Problem Relation Age of Onset   Thyroid disease Neg Hx     Social History   Socioeconomic History   Marital status: Single    Spouse name: Not on file   Number of children: Not on file   Years of education: Not on file   Highest education level: Not on file  Occupational History   Not on file  Tobacco Use   Smoking status: Every Day   Smokeless tobacco: Never  Substance and Sexual Activity   Alcohol use: No   Drug use: No   Sexual activity: Not on file  Other Topics Concern   Not on file  Social History Narrative   Not on file   Social Determinants of Health   Financial Resource Strain: Not on file  Food Insecurity: Not on file  Transportation Needs: Not on file  Physical Activity: Not on file  Stress: Not on file  Social Connections: Not on file  Intimate Partner Violence: Not on file    Review of Systems  Constitutional:  Negative for fatigue.  Respiratory:  Positive for shortness of breath.    Vitals:   08/25/21 1338  BP: 118/70  Pulse: 62  Temp: 98.1 F (36.7 C)  SpO2: 96%     Physical Exam Constitutional:      Appearance: She is obese.  HENT:     Head: Normocephalic.     Mouth/Throat:     Mouth: Mucous membranes are moist.  Cardiovascular:     Rate and Rhythm: Normal rate and regular rhythm.     Heart sounds: No murmur heard.   No friction rub.  Pulmonary:     Effort: No respiratory distress.     Breath sounds: No stridor. No wheezing or rhonchi.  Musculoskeletal:     Cervical back: No rigidity or tenderness.  Neurological:     Mental Status: She is alert.  Psychiatric:        Mood and Affect: Mood normal.     Data Reviewed: Previous CT scan of the chest from 2018 shows infiltrative process in the right upper lobe, emphysematous changes  Echocardiogram 07/04/2021 shows  ejection fraction of 55 to 60%, normal diastolic parameters, normal right ventricular systolic function and size  Echocardiogram from 2018 did reveal peak pulmonary pressures of 60, moderate regurgitation of the mitral valve, moderately dilated left atrium  Inhaler technique reviewed-she does use albuterol with a spacer  Assessment:  Emphysema -Noted on previous CT scan of the chest  Shortness of breath -Could not get better qualification or quantification of shortness of breath  Hypothyroidism  Active smoker  Plan/Recommendations: Obtain CT scan of the chest without contrast to follow-up on emphysematous changes  Obtain pulmonary function test  Obtain alpha-1 antitrypsin phenotype and level-she does have 2 siblings with COPD/emphysema  Smoking cessation counseling -Importance of quitting smoking discussed -Risks of continuing to smoke discussed  Encourage exercise as tolerated  Continue albuterol as needed, depending on pulmonary function test or other inhalers may be added although, she does not seem to be using much of the albuterol at present   Virl Diamond MD [facility] 08/25/2021, 1:41 PM  CC: Georgeanna Lea, MD

## 2021-08-27 ENCOUNTER — Ambulatory Visit (INDEPENDENT_AMBULATORY_CARE_PROVIDER_SITE_OTHER)
Admission: RE | Admit: 2021-08-27 | Discharge: 2021-08-27 | Disposition: A | Discharge status: Home / Self Care | Payer: No Typology Code available for payment source | Source: Ambulatory Visit | Attending: Pulmonary Disease | Admitting: Pulmonary Disease

## 2021-08-27 ENCOUNTER — Other Ambulatory Visit: Payer: Self-pay

## 2021-08-27 DIAGNOSIS — J439 Emphysema, unspecified: Secondary | ICD-10-CM | POA: Diagnosis not present

## 2021-09-08 LAB — ALPHA-1 ANTITRYPSIN PHENOTYPE: A-1 Antitrypsin, Ser: 198 mg/dL (ref 83–199)

## 2021-09-11 ENCOUNTER — Ambulatory Visit: Payer: No Typology Code available for payment source | Admitting: Endocrinology

## 2021-09-11 ENCOUNTER — Other Ambulatory Visit: Payer: Self-pay

## 2021-09-11 VITALS — BP 160/70 | HR 64 | Ht 64.0 in | Wt 170.8 lb

## 2021-09-11 DIAGNOSIS — E0590 Thyrotoxicosis, unspecified without thyrotoxic crisis or storm: Secondary | ICD-10-CM | POA: Diagnosis not present

## 2021-09-11 LAB — T4, FREE: Free T4: 0.85 ng/dL (ref 0.60–1.60)

## 2021-09-11 LAB — TSH: TSH: 0.51 u[IU]/mL (ref 0.35–5.50)

## 2021-09-11 NOTE — Patient Instructions (Addendum)
Your blood pressure is high today.  Please see your primary care provider soon, to have it rechecked Blood tests are requested for you today.  We'll let you know about the results.  If ever you have fever while taking methimazole, stop it and call us, even if the reason is obvious, because of the risk of a rare side-effect.  It is best to never miss the medication.  However, if you do miss it, next best is to double up the next time.   Please come back for a follow-up appointment in 6 months.   

## 2021-09-11 NOTE — Progress Notes (Signed)
Subjective:    Patient ID: [name], female    DOB: 07/09/1961, 60 y.o.   MRN: [id number]  HPI Pt returns for f/u of hyperthyroidism (dx'ed in early 2018; she has never had thyroid imaging, but Grave's Dz is suggested by severity and phys exam; tapazole was chosen as initial rx, due to severity--she wishes to continue, as she cannot be separated from her grandson).  pt states she feels well in general. She takes tapazole 5 mg qd.  Past Medical History:  Diagnosis Date   CHF (congestive heart failure) (HCC)    Graves disease    Herniated disc     Past Surgical History:  Procedure Laterality Date   back L4-5 herniation repair     BUNIONECTOMY     CARPAL TUNNEL RELEASE Right    CATARACT EXTRACTION, BILATERAL     plate and screws of R foot/arm      Social History   Socioeconomic History   Marital status: Single    Spouse name: Not on file   Number of children: Not on file   Years of education: Not on file   Highest education level: Not on file  Occupational History   Not on file  Tobacco Use   Smoking status: Every Day    Packs/day: 1.00    Years: 43.00    Pack years: 43.00    Types: Cigarettes    Start date: 1979   Smokeless tobacco: Never  Substance and Sexual Activity   Alcohol use: No   Drug use: No   Sexual activity: Not on file  Other Topics Concern   Not on file  Social History Narrative   Not on file   Social Determinants of Health   Financial Resource Strain: Not on file  Food Insecurity: Not on file  Transportation Needs: Not on file  Physical Activity: Not on file  Stress: Not on file  Social Connections: Not on file  Intimate Partner Violence: Not on file    Current Outpatient Medications on File Prior to Visit  Medication Sig Dispense Refill   Acetaminophen-Caffeine 500-65 MG TABS Take 2 tablets by mouth daily as needed (pain).      albuterol (VENTOLIN HFA) 108 (90 Base) MCG/ACT inhaler Inhale 2 puffs into the lungs every 4 (four) hours as  needed for wheezing or shortness of breath. 18 g 0   atorvastatin (LIPITOR) 10 MG tablet Take 1 tablet (10 mg total) by mouth daily. 90 tablet 3   cetirizine (ZYRTEC) 10 MG tablet Take 10 mg by mouth daily.     methimazole (TAPAZOLE) 5 MG tablet Take 1 tablet (5 mg total) by mouth daily. 90 tablet 3   omeprazole (PRILOSEC OTC) 20 MG tablet Take 20 mg by mouth daily.     No current facility-administered medications on file prior to visit.    Allergies  Allergen Reactions   Other Rash    Per pt medication to treat MRSA - unsure of name     Family History  Problem Relation Age of Onset   Thyroid disease Neg Hx     BP (!) 160/70 (BP Location: Right Arm, Patient Position: Sitting, Cuff Size: Normal)   Pulse 64   Ht 5\' 4"  (1.626 m)   Wt 170 lb 12.8 oz (77.5 kg)   SpO2 94%   BMI 29.32 kg/m    Review of Systems Denies fever.    Objective:   Physical Exam VITAL SIGNS:  See vs page GENERAL: no distress  EYES: slight bilat proptosis NECK: Thyroid is more than 10x normal size (diffuse).  No thyroid nodule is palpable.  No palpable lymphadenopathy at the anterior neck.   Lab Results  Component Value Date   TSH 0.51 09/11/2021   T3TOTAL 557 (H) 02/24/2017       Assessment & Plan:  Hyperthyroidism: well-controlled.  Please continue the same tapazole.

## 2021-10-03 ENCOUNTER — Other Ambulatory Visit: Payer: Self-pay | Admitting: Endocrinology

## 2021-10-13 ENCOUNTER — Ambulatory Visit (INDEPENDENT_AMBULATORY_CARE_PROVIDER_SITE_OTHER): Payer: No Typology Code available for payment source | Admitting: Pulmonary Disease

## 2021-10-13 ENCOUNTER — Encounter: Payer: Self-pay | Admitting: Pulmonary Disease

## 2021-10-13 ENCOUNTER — Other Ambulatory Visit: Payer: Self-pay

## 2021-10-13 VITALS — BP 142/80 | HR 72 | Temp 97.7°F | Ht 64.0 in | Wt 173.0 lb

## 2021-10-13 DIAGNOSIS — J439 Emphysema, unspecified: Secondary | ICD-10-CM

## 2021-10-13 DIAGNOSIS — R0609 Other forms of dyspnea: Secondary | ICD-10-CM | POA: Diagnosis not present

## 2021-10-13 LAB — PULMONARY FUNCTION TEST
DL/VA % pred: 92 %
DL/VA: 3.89 ml/min/mmHg/L
DLCO cor % pred: 73 %
DLCO cor: 14.86 ml/min/mmHg
DLCO unc % pred: 73 %
DLCO unc: 14.86 ml/min/mmHg
FEF 25-75 Post: 1.21 L/sec
FEF 25-75 Pre: 1.51 L/sec
FEF2575-%Change-Post: -19 %
FEF2575-%Pred-Post: 50 %
FEF2575-%Pred-Pre: 62 %
FEV1-%Change-Post: -5 %
FEV1-%Pred-Post: 66 %
FEV1-%Pred-Pre: 69 %
FEV1-Post: 1.71 L
FEV1-Pre: 1.81 L
FEV1FVC-%Change-Post: 0 %
FEV1FVC-%Pred-Pre: 98 %
FEV6-%Change-Post: -7 %
FEV6-%Pred-Post: 67 %
FEV6-%Pred-Pre: 72 %
FEV6-Post: 2.16 L
FEV6-Pre: 2.34 L
FEV6FVC-%Pred-Post: 103 %
FEV6FVC-%Pred-Pre: 103 %
FVC-%Change-Post: -5 %
FVC-%Pred-Post: 66 %
FVC-%Pred-Pre: 70 %
FVC-Post: 2.21 L
FVC-Pre: 2.34 L
Post FEV1/FVC ratio: 77 %
Post FEV6/FVC ratio: 100 %
Pre FEV1/FVC ratio: 77 %
Pre FEV6/FVC Ratio: 100 %
RV % pred: 78 %
RV: 1.54 L
TLC % pred: 78 %
TLC: 3.99 L

## 2021-10-13 NOTE — Patient Instructions (Signed)
I will see you about 6 months from now  Work on smoking cessation  CT scan with emphysema-this will be a progressive disease -Only significant change will be to continue to work on quitting smoking  PFT with no significant bronchodilator response Echocardiogram does not reveal pulmonary hypertension  Call with significant concerns

## 2021-10-13 NOTE — Patient Instructions (Signed)
Full PFT performed today. °

## 2021-10-13 NOTE — Progress Notes (Signed)
Full PFT performed today. °

## 2021-10-13 NOTE — Progress Notes (Signed)
[name]    [id number]    12-13-1960  Primary Care Physician:Amin, Loura Halt, MD  Referring Physician: Georgeanna Lea, MD [address] [place],  [place] [address]  Chief complaint:   Shortness of breath  HPI:  Shortness of breath on exertion Occasional tachycardia Fatigue  Continues to smoke a pack a day  History of hypothyroidism on thyroid supplementation  There was recent concern for pulmonary hypertension, recent echocardiogram did not confirm pulmonary hypertension History of congestive heart failure  As always done office work  Shortness of breath a few years back led to the CT scan of the chest showing emphysema   Outpatient Encounter Medications as of 08/25/2021  Medication Sig   Acetaminophen-Caffeine 500-65 MG TABS Take 2 tablets by mouth daily as needed (pain).    albuterol (VENTOLIN HFA) 108 (90 Base) MCG/ACT inhaler Inhale 2 puffs into the lungs every 4 (four) hours as needed for wheezing or shortness of breath.   atorvastatin (LIPITOR) 10 MG tablet Take 1 tablet (10 mg total) by mouth daily.   cetirizine (ZYRTEC) 10 MG tablet Take 10 mg by mouth daily.   methimazole (TAPAZOLE) 5 MG tablet Take 1 tablet (5 mg total) by mouth daily.   omeprazole (PRILOSEC OTC) 20 MG tablet Take 20 mg by mouth daily.   No facility-administered encounter medications on file as of 08/25/2021.    Allergies as of 08/25/2021 - Review Complete 08/25/2021  Allergen Reaction Noted   Other Rash 02/23/2017    Past Medical History:  Diagnosis Date   CHF (congestive heart failure) (HCC)    Graves disease    Herniated disc     Past Surgical History:  Procedure Laterality Date   back L4-5 herniation repair     BUNIONECTOMY     CARPAL TUNNEL RELEASE Right    CATARACT EXTRACTION, BILATERAL     plate and screws of R foot/arm      Family History  Problem Relation Age of Onset   Thyroid disease Neg Hx     Social History   Socioeconomic History    Marital status: Single    Spouse name: Not on file   Number of children: Not on file   Years of education: Not on file   Highest education level: Not on file  Occupational History   Not on file  Tobacco Use   Smoking status: Every Day   Smokeless tobacco: Never  Substance and Sexual Activity   Alcohol use: No   Drug use: No   Sexual activity: Not on file  Other Topics Concern   Not on file  Social History Narrative   Not on file   Social Determinants of Health   Financial Resource Strain: Not on file  Food Insecurity: Not on file  Transportation Needs: Not on file  Physical Activity: Not on file  Stress: Not on file  Social Connections: Not on file  Intimate Partner Violence: Not on file    Review of Systems  Constitutional:  Negative for fatigue.  Respiratory:  Positive for shortness of breath.   Psychiatric/Behavioral:  Negative for sleep disturbance.    Vitals:   08/25/21 1338  BP: 118/70  Pulse: 62  Temp: 98.1 F (36.7 C)  SpO2: 96%     Physical Exam Constitutional:      Appearance: She is obese.  HENT:     Head: Normocephalic.     Mouth/Throat:     Mouth: Mucous membranes  are moist.  Cardiovascular:     Rate and Rhythm: Normal rate and regular rhythm.     Heart sounds: No murmur heard.   No friction rub.  Pulmonary:     Effort: No respiratory distress.     Breath sounds: No stridor. No wheezing or rhonchi.  Musculoskeletal:     Cervical back: No rigidity or tenderness.  Neurological:     Mental Status: She is alert.  Psychiatric:        Mood and Affect: Mood normal.     Data Reviewed: Previous CT scan of the chest from 2018 shows infiltrative process in the right upper lobe, emphysematous changes  Repeat CT scan of the chest shows extensive emphysematous changes which was reviewed with the patient  Echocardiogram 07/04/2021 shows ejection fraction of 55 to 60%, normal diastolic parameters, normal right ventricular systolic function and  size  Echocardiogram from 2018 did reveal peak pulmonary pressures of 60, moderate regurgitation of the mitral valve, moderately dilated left atrium  Inhaler technique reviewed-she does use albuterol with a spacer  Pulmonary function test reviewed showing no obstruction, no significant bronchodilator response Mild restriction, mild reduction in diffusing capacity  Lab work shows normal alpha-1 antitrypsin levels, phenotype PI MM  Assessment:  Emphysema -Noted on current CT and previous  Shortness of breath -This is likely multifactorial  Hypothyroidism  Active smoker  No evidence of pulmonary hypertension on echo   Plan/Recommendations:  Smoking cessation counseling  She did request for a letter stating that she can work from home -I did let her know that I have no clinical indication for stating that she cannot work in any specific environment especially as she has not described a work environment that places her lungs at risk.  The risk for progressive lung disease is much higher with her continuing to smoke  Continue albuterol as needed  Tentative follow-up in 6 months  I spent 30 minutes dedicated to the care of this patient on the date of this encounter to include previsit review of records, face-to-face time with the patient discussing conditions above, post visit ordering of testing, clinical documentation with electronic health record, reviewing CT and order results with patient and communicated necessary findings to members of the patient's care team   Virl Diamond MD [facility] 08/25/2021, 1:41 PM  CC: Georgeanna Lea, MD

## 2021-11-19 ENCOUNTER — Encounter: Payer: Self-pay | Admitting: Cardiology

## 2021-11-19 ENCOUNTER — Other Ambulatory Visit: Payer: Self-pay

## 2021-11-19 ENCOUNTER — Ambulatory Visit (INDEPENDENT_AMBULATORY_CARE_PROVIDER_SITE_OTHER): Payer: No Typology Code available for payment source | Admitting: Cardiology

## 2021-11-19 VITALS — BP 132/70 | HR 62 | Ht 64.0 in | Wt 170.0 lb

## 2021-11-19 DIAGNOSIS — I272 Pulmonary hypertension, unspecified: Secondary | ICD-10-CM

## 2021-11-19 DIAGNOSIS — R0609 Other forms of dyspnea: Secondary | ICD-10-CM

## 2021-11-19 DIAGNOSIS — F17200 Nicotine dependence, unspecified, uncomplicated: Secondary | ICD-10-CM | POA: Diagnosis not present

## 2021-11-19 DIAGNOSIS — J431 Panlobular emphysema: Secondary | ICD-10-CM | POA: Diagnosis not present

## 2021-11-19 DIAGNOSIS — I5021 Acute systolic (congestive) heart failure: Secondary | ICD-10-CM

## 2021-11-19 DIAGNOSIS — R079 Chest pain, unspecified: Secondary | ICD-10-CM

## 2021-11-19 NOTE — Patient Instructions (Signed)
Medication Instructions:  Your physician recommends that you continue on your current medications as directed. Please refer to the Current Medication list given to you today.  *If you need a refill on your cardiac medications before your next appointment, please call your pharmacy*   Lab Work: Your physician recommends that you have a troponin I today in the office.  If you have labs (blood work) drawn today and your tests are completely normal, you will receive your results only by: MyChart Message (if you have MyChart) OR A paper copy in the mail If you have any lab test that is abnormal or we need to change your treatment, we will call you to review the results.   Testing/Procedures: Your physician has requested that you have an echocardiogram. Echocardiography is a painless test that uses sound waves to create images of your heart. It provides your doctor with information about the size and shape of your heart and how well your hearts chambers and valves are working. This procedure takes approximately one hour. There are no restrictions for this procedure.    Follow-Up: At [facility], you and your health needs are our priority.  As part of our continuing mission to provide you with exceptional heart care, we have created designated Provider Care Teams.  These Care Teams include your primary Cardiologist (physician) and Advanced Practice Providers (APPs -  Physician Assistants and Nurse Practitioners) who all work together to provide you with the care you need, when you need it.  We recommend signing up for the patient portal called "MyChart".  Sign up information is provided on this After Visit Summary.  MyChart is used to connect with patients for Virtual Visits (Telemedicine).  Patients are able to view lab/test results, encounter notes, upcoming appointments, etc.  Non-urgent messages can be sent to your provider as well.   To learn more about what you can do with MyChart, go to  [email or web address].    Your next appointment:   6 month(s)  The format for your next appointment:   In Person  Provider:   Gypsy Balsam, MD   Other Instructions Echocardiogram An echocardiogram is a test that uses sound waves (ultrasound) to produce images of the heart. Images from an echocardiogram can provide important information about: Heart size and shape. The size and thickness and movement of your heart's walls. Heart muscle function and strength. Heart valve function or if you have stenosis. Stenosis is when the heart valves are too narrow. If blood is flowing backward through the heart valves (regurgitation). A tumor or infectious growth around the heart valves. Areas of heart muscle that are not working well because of poor blood flow or injury from a heart attack. Aneurysm detection. An aneurysm is a weak or damaged part of an artery wall. The wall bulges out from the normal force of blood pumping through the body. Tell a health care provider about: Any allergies you have. All medicines you are taking, including vitamins, herbs, eye drops, creams, and over-the-counter medicines. Any blood disorders you have. Any surgeries you have had. Any medical conditions you have. Whether you are pregnant or may be pregnant. What are the risks? Generally, this is a safe test. However, problems may occur, including an allergic reaction to dye (contrast) that may be used during the test. What happens before the test? No specific preparation is needed. You may eat and drink normally. What happens during the test? You will take off your clothes from the waist up and put  on a hospital gown. Electrodes or electrocardiogram (ECG)patches may be placed on your chest. The electrodes or patches are then connected to a device that monitors your heart rate and rhythm. You will lie down on a table for an ultrasound exam. A gel will be applied to your chest to help sound waves pass  through your skin. A handheld device, called a transducer, will be pressed against your chest and moved over your heart. The transducer produces sound waves that travel to your heart and bounce back (or "echo" back) to the transducer. These sound waves will be captured in real-time and changed into images of your heart that can be viewed on a video monitor. The images will be recorded on a computer and reviewed by your health care provider. You may be asked to change positions or hold your breath for a short time. This makes it easier to get different views or better views of your heart. In some cases, you may receive contrast through an IV in one of your veins. This can improve the quality of the pictures from your heart. The procedure may vary among health care providers and hospitals.   What can I expect after the test? You may return to your normal, everyday life, including diet, activities, and medicines, unless your health care provider tells you not to do that. Follow these instructions at home: It is up to you to get the results of your test. Ask your health care provider, or the department that is doing the test, when your results will be ready. Keep all follow-up visits. This is important. Summary An echocardiogram is a test that uses sound waves (ultrasound) to produce images of the heart. Images from an echocardiogram can provide important information about the size and shape of your heart, heart muscle function, heart valve function, and other possible heart problems. You do not need to do anything to prepare before this test. You may eat and drink normally. After the echocardiogram is completed, you may return to your normal, everyday life, unless your health care provider tells you not to do that. This information is not intended to replace advice given to you by your health care provider. Make sure you discuss any questions you have with your health care provider. Document Revised:  07/09/2020 Document Reviewed: 07/09/2020 Elsevier Patient Education  2021 [employer].

## 2021-11-19 NOTE — Progress Notes (Signed)
Cardiology Office Note:    Date:  11/19/2021   ID:  [name], DOB [birth year], MRN [id number]  PCP:  Dimple Nanas, MD  Cardiologist:  Gypsy Balsam, MD    Referring MD: Dimple Nanas, MD   Chief Complaint  Patient presents with   Follow-up  Doing well still get some shortness of breath  History of Present Illness:    [name] is a 60 y.o. female who was sent to [place] for evaluation because of shortness of breath.  Suspicion was also for pulmonary hypertension apparently in 2019 she had echocardiogram done showing pulmonary pressure of 60 mmHg however repeated echocardiogram in December of this year did not confirm that.  Right ventricle was normal size and function.  She was referred eventually to pulmonary, she was found to have some significant emphysema.  Comes today to my office for regular follow-up still complain of some shortness of breath interestingly she is telling me today that she got heartburn.  Usually she takes some antiacid medication with good relief.  Past Medical History:  Diagnosis Date   CHF (congestive heart failure) (HCC)    Graves disease    Herniated disc     Past Surgical History:  Procedure Laterality Date   back L4-5 herniation repair     BUNIONECTOMY     CARPAL TUNNEL RELEASE Right    CATARACT EXTRACTION, BILATERAL     plate and screws of R foot/arm      Current Medications: Current Meds  Medication Sig   Acetaminophen-Caffeine 500-65 MG TABS Take 2 tablets by mouth daily as needed (pain).    albuterol (VENTOLIN HFA) 108 (90 Base) MCG/ACT inhaler Inhale 2 puffs into the lungs every 4 (four) hours as needed for wheezing or shortness of breath.   atorvastatin (LIPITOR) 10 MG tablet Take 1 tablet (10 mg total) by mouth daily.   cetirizine (ZYRTEC) 10 MG tablet Take 10 mg by mouth daily.   methimazole (TAPAZOLE) 5 MG tablet Take 1 tablet (5 mg total) by mouth daily.   omeprazole (PRILOSEC OTC) 20 MG tablet Take 20 mg by mouth  daily.     Allergies:   Other   Social History   Socioeconomic History   Marital status: Single    Spouse name: Not on file   Number of children: Not on file   Years of education: Not on file   Highest education level: Not on file  Occupational History   Not on file  Tobacco Use   Smoking status: Every Day    Packs/day: 1.00    Years: 43.00    Pack years: 43.00    Types: Cigarettes    Start date: 1979   Smokeless tobacco: Never  Substance and Sexual Activity   Alcohol use: No   Drug use: No   Sexual activity: Not on file  Other Topics Concern   Not on file  Social History Narrative   Not on file   Social Determinants of Health   Financial Resource Strain: Not on file  Food Insecurity: Not on file  Transportation Needs: Not on file  Physical Activity: Not on file  Stress: Not on file  Social Connections: Not on file     Family History: The patient's family history is negative for Thyroid disease. ROS:   Please see the history of present illness.    All 14 point review of systems negative except as described per history of present illness  EKGs/Labs/Other Studies Reviewed:  Recent Labs: 05/13/2021: B Natriuretic Peptide 53.8; BUN 14; Creatinine, Ser 0.66; Hemoglobin 15.0; Magnesium 2.0; Platelets 238; Potassium 3.2; Sodium 137 09/11/2021: TSH 0.51  Recent Lipid Panel    Component Value Date/Time   CHOL 201 (H) 07/23/2021 0938   TRIG 121 07/23/2021 0938   HDL 45 07/23/2021 0938   CHOLHDL 4.5 (H) 07/23/2021 0938   CHOLHDL 3.1 02/23/2017 1834   VLDL 15 02/23/2017 1834   LDLCALC 134 (H) 07/23/2021 0938    Physical Exam:    VS:  BP 132/70 (BP Location: Right Arm, Patient Position: Sitting)    Pulse 62    Ht 5\' 4"  (1.626 m)    Wt 170 lb (77.1 kg)    SpO2 95%    BMI 29.18 kg/m     Wt Readings from Last 3 Encounters:  11/19/21 170 lb (77.1 kg)  10/13/21 173 lb (78.5 kg)  09/11/21 170 lb 12.8 oz (77.5 kg)     GEN:  Well nourished, well developed  in no acute distress HEENT: Normal NECK: No JVD; No carotid bruits LYMPHATICS: No lymphadenopathy CARDIAC: RRR, no murmurs, no rubs, no gallops RESPIRATORY:  Clear to auscultation without rales, wheezing or rhonchi  ABDOMEN: Soft, non-tender, non-distended MUSCULOSKELETAL:  No edema; No deformity  SKIN: Warm and dry LOWER EXTREMITIES: no swelling NEUROLOGIC:  Alert and oriented x 3 PSYCHIATRIC:  Normal affect   ASSESSMENT:    1. Pulmonary hypertension, unspecified (HCC) 60 mmHg based on echocardiogram from 2018   2. Panlobular emphysema (HCC)   3. Dyspnea on exertion   4. Tobacco use disorder    PLAN:    In order of problems listed above:  Pulmonary hypertension questionable diagnosis or other doubtful diagnosis.  I will repeat echocardiogram 1 more time make sure not missing this diagnosis.  Echocardiogram to be scheduled the beginning of next year Emphysema that being followed by pulmonary. Dyspnea on exertion clearly multifactorial and smoking obviously play significant role here. Tobacco use disorder we had a long discussion I strongly recommended to quit. Dyslipidemia I did review K PN which show me LDL 134 HDL 45 this is from August of this year.  I will check her fasting profile again.  She is on Lipitor 10. Heartburn today I will do EKG first and then she will have troponin I drawn.  EKG did not show any acute changes.   Medication Adjustments/Labs and Tests Ordered: Current medicines are reviewed at length with the patient today.  Concerns regarding medicines are outlined above.  No orders of the defined types were placed in this encounter.  Medication changes: No orders of the defined types were placed in this encounter.   Signed, September, MD, [facility] 11/19/2021 9:54 AM    [facility]

## 2021-11-20 LAB — TROPONIN T: Troponin T (Highly Sensitive): <6 ng/L (ref 0–14)

## 2021-11-21 ENCOUNTER — Telehealth: Payer: Self-pay

## 2021-11-21 NOTE — Telephone Encounter (Signed)
Tried calling patient. No answer and no voicemail set up for me to leave a message. 

## 2021-11-21 NOTE — Telephone Encounter (Signed)
-----   Message from Georgeanna Lea, MD sent at 11/21/2021  8:43 AM EST ----- Troponin I negative

## 2021-11-24 NOTE — Progress Notes (Signed)
Normal alpha-1 antitrypsin levels °

## 2021-12-18 ENCOUNTER — Ambulatory Visit (HOSPITAL_BASED_OUTPATIENT_CLINIC_OR_DEPARTMENT_OTHER): Admission: RE | Admit: 2021-12-18 | Payer: No Typology Code available for payment source | Source: Ambulatory Visit

## 2022-03-19 ENCOUNTER — Ambulatory Visit: Payer: No Typology Code available for payment source | Admitting: Endocrinology

## 2022-03-19 ENCOUNTER — Encounter: Payer: Self-pay | Admitting: Endocrinology

## 2022-03-19 VITALS — BP 148/82 | HR 71 | Ht 64.0 in | Wt 173.6 lb

## 2022-03-19 DIAGNOSIS — E0590 Thyrotoxicosis, unspecified without thyrotoxic crisis or storm: Secondary | ICD-10-CM

## 2022-03-19 LAB — TSH: TSH: 0.23 u[IU]/mL — ABNORMAL LOW (ref 0.35–5.50)

## 2022-03-19 LAB — T4, FREE: Free T4: 0.96 ng/dL (ref 0.60–1.60)

## 2022-03-19 MED ORDER — METHIMAZOLE 5 MG PO TABS
7.5000 mg | ORAL_TABLET | Freq: Every day | ORAL | 1 refills | Status: DC
Start: 2022-03-19 — End: 2022-09-18

## 2022-03-19 NOTE — Progress Notes (Signed)
? ?Subjective:  ? ? Patient ID: [name], female    DOB: Dec 30, 1960, 61 y.o.   MRN: [id number] ? ?HPI ?Pt returns for f/u of hyperthyroidism (dx'ed in early 2018; she has never had thyroid imaging, but Grave's Dz is suggested by severity and phys exam; tapazole was chosen as initial rx, due to severity--she wishes to continue, as she cannot be separated from her grandson).  pt states she feels well in general. She takes tapazole 5 mg qd.   ?Past Medical History:  ?Diagnosis Date  ? CHF (congestive heart failure) (HCC)   ? Graves disease   ? Herniated disc   ? ? ?Past Surgical History:  ?Procedure Laterality Date  ? back L4-5 herniation repair    ? BUNIONECTOMY    ? CARPAL TUNNEL RELEASE Right   ? CATARACT EXTRACTION, BILATERAL    ? plate and screws of R foot/arm    ? ? ?Social History  ? ?Socioeconomic History  ? Marital status: Single  ?  Spouse name: Not on file  ? Number of children: Not on file  ? Years of education: Not on file  ? Highest education level: Not on file  ?Occupational History  ? Not on file  ?Tobacco Use  ? Smoking status: Every Day  ?  Packs/day: 1.00  ?  Years: 43.00  ?  Pack years: 43.00  ?  Types: Cigarettes  ?  Start date: 41  ? Smokeless tobacco: Never  ?Substance and Sexual Activity  ? Alcohol use: No  ? Drug use: No  ? Sexual activity: Not on file  ?Other Topics Concern  ? Not on file  ?Social History Narrative  ? Not on file  ? ?Social Determinants of Health  ? ?Financial Resource Strain: Not on file  ?Food Insecurity: Not on file  ?Transportation Needs: Not on file  ?Physical Activity: Not on file  ?Stress: Not on file  ?Social Connections: Not on file  ?Intimate Partner Violence: Not on file  ? ? ?Current Outpatient Medications on File Prior to Visit  ?Medication Sig Dispense Refill  ? Acetaminophen-Caffeine 500-65 MG TABS Take 2 tablets by mouth daily as needed (pain).     ? albuterol (VENTOLIN HFA) 108 (90 Base) MCG/ACT inhaler Inhale 2 puffs into the lungs every 4 (four) hours  as needed for wheezing or shortness of breath. 18 g 0  ? cetirizine (ZYRTEC) 10 MG tablet Take 10 mg by mouth daily.    ? methimazole (TAPAZOLE) 5 MG tablet Take 1 tablet (5 mg total) by mouth daily. 90 tablet 3  ? omeprazole (PRILOSEC OTC) 20 MG tablet Take 20 mg by mouth daily.    ? atorvastatin (LIPITOR) 10 MG tablet Take 1 tablet (10 mg total) by mouth daily. 90 tablet 3  ? ?No current facility-administered medications on file prior to visit.  ? ? ?Allergies  ?Allergen Reactions  ? Other Rash  ?  Per pt medication to treat MRSA - unsure of name   ? ? ?Family History  ?Problem Relation Age of Onset  ? Thyroid disease Neg Hx   ? ? ?BP (!) 148/82 (BP Location: Left Arm, Patient Position: Sitting, Cuff Size: Normal)   Pulse 71   Ht 5\' 4"  (1.626 m)   Wt 173 lb 9.6 oz (78.7 kg)   SpO2 95%   BMI 29.80 kg/m?  ? ? ?Review of Systems ?Denies fever.   ?   ?Objective:  ? Physical Exam ?VITAL SIGNS:  See vs page ?  GENERAL: no distress ?EYES: slight bilat proptosis ?NECK: Thyroid is 10x normal size (R>L).  No thyroid nodule is palpable.  No palpable lymphadenopathy at the anterior neck.   ? ? ?Lab Results  ?Component Value Date  ? TSH 0.23 (L) 03/19/2022  ? T3TOTAL 557 (H) 02/24/2017  ? ?   ?Assessment & Plan:  ?Hyperthyroidism: uncontrolled. Increase tapazole to 7.5/d ? ?

## 2022-03-19 NOTE — Patient Instructions (Addendum)
Your blood pressure is high today.  Please see your primary care provider soon, to have it rechecked Blood tests are requested for you today.  We'll let you know about the results.  If ever you have fever while taking methimazole, stop it and call us, even if the reason is obvious, because of the risk of a rare side-effect.  It is best to never miss the medication.  However, if you do miss it, next best is to double up the next time.   Please come back for a follow-up appointment in 6 months.   

## 2022-06-10 ENCOUNTER — Ambulatory Visit: Payer: No Typology Code available for payment source | Admitting: Cardiology

## 2022-06-10 ENCOUNTER — Encounter: Payer: Self-pay | Admitting: Cardiology

## 2022-06-10 VITALS — BP 130/80 | HR 68 | Ht 64.0 in | Wt 171.0 lb

## 2022-06-10 DIAGNOSIS — R001 Bradycardia, unspecified: Secondary | ICD-10-CM | POA: Diagnosis not present

## 2022-06-10 DIAGNOSIS — F17200 Nicotine dependence, unspecified, uncomplicated: Secondary | ICD-10-CM | POA: Diagnosis not present

## 2022-06-10 DIAGNOSIS — R0609 Other forms of dyspnea: Secondary | ICD-10-CM | POA: Diagnosis not present

## 2022-06-10 DIAGNOSIS — I272 Pulmonary hypertension, unspecified: Secondary | ICD-10-CM

## 2022-06-10 NOTE — Progress Notes (Unsigned)
Cardiology Office Note:    Date:  06/10/2022   ID:  [name], DOB Nov 08, 1961, MRN [id number]  PCP:  Dimple Nanas, MD  Cardiologist:  Gypsy Balsam, MD    Referring MD: Dimple Nanas, MD   Chief Complaint  Patient presents with   Follow-up  Still short of breath  History of Present Illness:    [name] is a 61 y.o. female who was referred to [place] for evaluation of pulmonary hypertension, echocardiogram has been done which showed normal pulm artery pressure also normal right ventricle size and function as well as normal right atrial size.  I will repeat echocardiogram however she did not showed up for echocardiogram.  Comes in today 2 months of follow-up overall doing the same she is complaining of having some shortness of breath sadly she still continues to smoke.  She works from home.  Denies have any swelling of lower extremities no palpitations dizziness passing out  Past Medical History:  Diagnosis Date   CHF (congestive heart failure) (HCC)    Graves disease    Herniated disc     Past Surgical History:  Procedure Laterality Date   back L4-5 herniation repair     BUNIONECTOMY     CARPAL TUNNEL RELEASE Right    CATARACT EXTRACTION, BILATERAL     plate and screws of R foot/arm      Current Medications: Current Meds  Medication Sig   Acetaminophen-Caffeine 500-65 MG TABS Take 2 tablets by mouth daily as needed (pain).    albuterol (VENTOLIN HFA) 108 (90 Base) MCG/ACT inhaler Inhale 2 puffs into the lungs every 4 (four) hours as needed for wheezing or shortness of breath.   atorvastatin (LIPITOR) 10 MG tablet Take 1 tablet (10 mg total) by mouth daily.   cetirizine (ZYRTEC) 10 MG tablet Take 10 mg by mouth daily.   methimazole (TAPAZOLE) 5 MG tablet Take 1.5 tablets (7.5 mg total) by mouth daily.   omeprazole (PRILOSEC OTC) 20 MG tablet Take 20 mg by mouth daily.     Allergies:   Other   Social History   Socioeconomic History   Marital status:  Single    Spouse name: Not on file   Number of children: Not on file   Years of education: Not on file   Highest education level: Not on file  Occupational History   Not on file  Tobacco Use   Smoking status: Every Day    Packs/day: 1.00    Years: 43.00    Total pack years: 43.00    Types: Cigarettes    Start date: 1979   Smokeless tobacco: Never  Substance and Sexual Activity   Alcohol use: No   Drug use: No   Sexual activity: Not on file  Other Topics Concern   Not on file  Social History Narrative   Not on file   Social Determinants of Health   Financial Resource Strain: Not on file  Food Insecurity: Not on file  Transportation Needs: Not on file  Physical Activity: Not on file  Stress: Not on file  Social Connections: Not on file     Family History: The patient's family history is negative for Thyroid disease. ROS:   Please see the history of present illness.    All 14 point review of systems negative except as described per history of present illness  EKGs/Labs/Other Studies Reviewed:      Recent Labs: 03/19/2022: TSH 0.23  Recent Lipid Panel  Component Value Date/Time   CHOL 201 (H) 07/23/2021 0938   TRIG 121 07/23/2021 0938   HDL 45 07/23/2021 0938   CHOLHDL 4.5 (H) 07/23/2021 0938   CHOLHDL 3.1 02/23/2017 1834   VLDL 15 02/23/2017 1834   LDLCALC 134 (H) 07/23/2021 0938    Physical Exam:    VS:  BP 130/80 (BP Location: Right Arm, Patient Position: Sitting, Cuff Size: Normal)   Pulse 68   Ht 5\' 4"  (1.626 m)   Wt 171 lb (77.6 kg)   SpO2 95%   BMI 29.35 kg/m     Wt Readings from Last 3 Encounters:  06/10/22 171 lb (77.6 kg)  03/19/22 173 lb 9.6 oz (78.7 kg)  11/19/21 170 lb (77.1 kg)     GEN:  Well nourished, well developed in no acute distress HEENT: Normal NECK: No JVD; No carotid bruits LYMPHATICS: No lymphadenopathy CARDIAC: RRR, no murmurs, no rubs, no gallops RESPIRATORY: Poor entry bilaterally with few wheezes and  rhonchi ABDOMEN: Soft, non-tender, non-distended MUSCULOSKELETAL:  No edema; No deformity  SKIN: Warm and dry LOWER EXTREMITIES: no swelling NEUROLOGIC:  Alert and oriented x 3 PSYCHIATRIC:  Normal affect   ASSESSMENT:    1. Pulmonary hypertension, unspecified (HCC) 60 mmHg based on echocardiogram from 2018   2. Sinus bradycardia   3. Tobacco use disorder   4. Dyspnea on exertion    PLAN:    In order of problems listed above:  Pulmonary hypertension questionable done for diagnosis I will repeat echocardiogram if echocardiogram show elevation of pulmonary pressures she will require cardiac catheterization.  I suspect her dyspnea on exertion is related to smoking and some deconditioning.  Obesity with a long discussion about need to quit smoking as well as discussion about ability to exercise Sinus bradycardia: No dizziness no passing out Dyslipidemia I did review K PN which only her LDL of 134 HDL 45 (from year ago she is taking Lipitor 10 we will check her fasting lipid profile   Medication Adjustments/Labs and Tests Ordered: Current medicines are reviewed at length with the patient today.  Concerns regarding medicines are outlined above.  No orders of the defined types were placed in this encounter.  Medication changes: No orders of the defined types were placed in this encounter.   Signed, 2019, MD, [facility] 06/10/2022 2:20 PM    [facility]

## 2022-06-10 NOTE — Patient Instructions (Signed)
Medication Instructions:  Your physician recommends that you continue on your current medications as directed. Please refer to the Current Medication list given to you today.  *If you need a refill on your cardiac medications before your next appointment, please call your pharmacy*   Lab Work:Your physician recommends that you return for lab work in: Today for a Lipid Profile  Your physician recommends that you continue on your current medications as directed. Please refer to the Current Medication list given to you today. If you have labs (blood work) drawn today and your tests are completely normal, you will receive your results only by: MyChart Message (if you have MyChart) OR A paper copy in the mail If you have any lab test that is abnormal or we need to change your treatment, we will call you to review the results.   Testing/Procedures: Your physician has requested that you have an echocardiogram. Echocardiography is a painless test that uses sound waves to create images of your heart. It provides your doctor with information about the size and shape of your heart and how well your heart's chambers and valves are working. This procedure takes approximately one hour. There are no restrictions for this procedure.    Follow-Up: At [facility], you and your health needs are our priority.  As part of our continuing mission to provide you with exceptional heart care, we have created designated Provider Care Teams.  These Care Teams include your primary Cardiologist (physician) and Advanced Practice Providers (APPs -  Physician Assistants and Nurse Practitioners) who all work together to provide you with the care you need, when you need it.  We recommend signing up for the patient portal called "MyChart".  Sign up information is provided on this After Visit Summary.  MyChart is used to connect with patients for Virtual Visits (Telemedicine).  Patients are able to view lab/test results,  encounter notes, upcoming appointments, etc.  Non-urgent messages can be sent to your provider as well.   To learn more about what you can do with MyChart, go to [email or web address].    Your next appointment:   6 month(s)  The format for your next appointment:   In Person  Provider:   Gypsy Balsam, MD    Other Instructions   Important Information About Sugar

## 2022-06-11 LAB — LIPID PANEL
Chol/HDL Ratio: 4.1 ratio (ref 0.0–4.4)
Cholesterol, Total: 157 mg/dL (ref 100–199)
HDL: 38 mg/dL — ABNORMAL LOW (ref >39)
LDL Chol Calc (NIH): 92 mg/dL (ref 0–99)
Triglycerides: 151 mg/dL — ABNORMAL HIGH (ref 0–149)
VLDL Cholesterol Cal: 27 mg/dL (ref 5–40)

## 2022-06-30 ENCOUNTER — Ambulatory Visit (HOSPITAL_BASED_OUTPATIENT_CLINIC_OR_DEPARTMENT_OTHER): Admission: RE | Admit: 2022-06-30 | Payer: No Typology Code available for payment source | Source: Ambulatory Visit

## 2022-07-01 ENCOUNTER — Encounter (HOSPITAL_COMMUNITY): Payer: Self-pay | Admitting: Cardiology

## 2022-08-30 ENCOUNTER — Inpatient Hospital Stay (HOSPITAL_COMMUNITY)
Admission: EM | Admit: 2022-08-30 | Discharge: 2022-08-31 | DRG: 193 | Disposition: A | Discharge status: Home / Self Care | Payer: No Typology Code available for payment source | Source: Ambulatory Visit | Attending: Internal Medicine | Admitting: Internal Medicine

## 2022-08-30 ENCOUNTER — Ambulatory Visit: Admission: EM | Admit: 2022-08-30 | Discharge: 2022-08-30 | Payer: No Typology Code available for payment source

## 2022-08-30 ENCOUNTER — Encounter: Payer: Self-pay | Admitting: Emergency Medicine

## 2022-08-30 ENCOUNTER — Inpatient Hospital Stay (HOSPITAL_COMMUNITY): Payer: No Typology Code available for payment source

## 2022-08-30 ENCOUNTER — Emergency Department (HOSPITAL_COMMUNITY): Payer: No Typology Code available for payment source

## 2022-08-30 ENCOUNTER — Encounter (HOSPITAL_COMMUNITY): Payer: Self-pay

## 2022-08-30 ENCOUNTER — Other Ambulatory Visit: Payer: Self-pay

## 2022-08-30 DIAGNOSIS — Z881 Allergy status to other antibiotic agents status: Secondary | ICD-10-CM

## 2022-08-30 DIAGNOSIS — Z8249 Family history of ischemic heart disease and other diseases of the circulatory system: Secondary | ICD-10-CM

## 2022-08-30 DIAGNOSIS — J440 Chronic obstructive pulmonary disease with acute lower respiratory infection: Secondary | ICD-10-CM | POA: Diagnosis present

## 2022-08-30 DIAGNOSIS — Z720 Tobacco use: Secondary | ICD-10-CM | POA: Diagnosis not present

## 2022-08-30 DIAGNOSIS — E0590 Thyrotoxicosis, unspecified without thyrotoxic crisis or storm: Secondary | ICD-10-CM | POA: Diagnosis present

## 2022-08-30 DIAGNOSIS — I110 Hypertensive heart disease with heart failure: Secondary | ICD-10-CM | POA: Diagnosis present

## 2022-08-30 DIAGNOSIS — E785 Hyperlipidemia, unspecified: Secondary | ICD-10-CM | POA: Diagnosis present

## 2022-08-30 DIAGNOSIS — J9601 Acute respiratory failure with hypoxia: Secondary | ICD-10-CM | POA: Diagnosis present

## 2022-08-30 DIAGNOSIS — I272 Pulmonary hypertension, unspecified: Secondary | ICD-10-CM

## 2022-08-30 DIAGNOSIS — E876 Hypokalemia: Secondary | ICD-10-CM | POA: Diagnosis present

## 2022-08-30 DIAGNOSIS — J189 Pneumonia, unspecified organism: Secondary | ICD-10-CM | POA: Diagnosis not present

## 2022-08-30 DIAGNOSIS — R0682 Tachypnea, not elsewhere classified: Secondary | ICD-10-CM

## 2022-08-30 DIAGNOSIS — R0602 Shortness of breath: Secondary | ICD-10-CM | POA: Diagnosis present

## 2022-08-30 DIAGNOSIS — I509 Heart failure, unspecified: Secondary | ICD-10-CM

## 2022-08-30 DIAGNOSIS — Z1152 Encounter for screening for COVID-19: Secondary | ICD-10-CM | POA: Diagnosis not present

## 2022-08-30 DIAGNOSIS — J180 Bronchopneumonia, unspecified organism: Principal | ICD-10-CM | POA: Diagnosis present

## 2022-08-30 DIAGNOSIS — J441 Chronic obstructive pulmonary disease with (acute) exacerbation: Secondary | ICD-10-CM | POA: Diagnosis present

## 2022-08-30 DIAGNOSIS — Z79899 Other long term (current) drug therapy: Secondary | ICD-10-CM

## 2022-08-30 DIAGNOSIS — R051 Acute cough: Secondary | ICD-10-CM

## 2022-08-30 DIAGNOSIS — I5032 Chronic diastolic (congestive) heart failure: Secondary | ICD-10-CM | POA: Diagnosis present

## 2022-08-30 DIAGNOSIS — F17210 Nicotine dependence, cigarettes, uncomplicated: Secondary | ICD-10-CM | POA: Diagnosis present

## 2022-08-30 DIAGNOSIS — Z23 Encounter for immunization: Secondary | ICD-10-CM

## 2022-08-30 LAB — HEPATIC FUNCTION PANEL
ALT: 10 U/L (ref 0–44)
AST: 14 U/L — ABNORMAL LOW (ref 15–41)
Albumin: 3.3 g/dL — ABNORMAL LOW (ref 3.5–5.0)
Alkaline Phosphatase: 82 U/L (ref 38–126)
Bilirubin, Direct: <0.1 mg/dL (ref 0.0–0.2)
Total Bilirubin: 0.2 mg/dL — ABNORMAL LOW (ref 0.3–1.2)
Total Protein: 6.6 g/dL (ref 6.5–8.1)

## 2022-08-30 LAB — COMPREHENSIVE METABOLIC PANEL
ALT: 10 U/L (ref 0–44)
AST: 11 U/L — ABNORMAL LOW (ref 15–41)
Albumin: 3.4 g/dL — ABNORMAL LOW (ref 3.5–5.0)
Alkaline Phosphatase: 89 U/L (ref 38–126)
Anion gap: 8 (ref 5–15)
BUN: 12 mg/dL (ref 6–20)
CO2: 29 mmol/L (ref 22–32)
Calcium: 8.9 mg/dL (ref 8.9–10.3)
Chloride: 103 mmol/L (ref 98–111)
Creatinine, Ser: 0.4 mg/dL — ABNORMAL LOW (ref 0.44–1.00)
GFR, Estimated: >60 mL/min (ref >60)
Glucose, Bld: 132 mg/dL — ABNORMAL HIGH (ref 70–99)
Potassium: 2.4 mmol/L — CL (ref 3.5–5.1)
Sodium: 140 mmol/L (ref 135–145)
Total Bilirubin: 0.7 mg/dL (ref 0.3–1.2)
Total Protein: 6.9 g/dL (ref 6.5–8.1)

## 2022-08-30 LAB — BASIC METABOLIC PANEL
Anion gap: 8 (ref 5–15)
BUN: 12 mg/dL (ref 6–20)
CO2: 25 mmol/L (ref 22–32)
Calcium: 8.6 mg/dL — ABNORMAL LOW (ref 8.9–10.3)
Chloride: 107 mmol/L (ref 98–111)
Creatinine, Ser: 0.56 mg/dL (ref 0.44–1.00)
GFR, Estimated: >60 mL/min (ref >60)
Glucose, Bld: 292 mg/dL — ABNORMAL HIGH (ref 70–99)
Potassium: 2.5 mmol/L — CL (ref 3.5–5.1)
Sodium: 140 mmol/L (ref 135–145)

## 2022-08-30 LAB — CBC WITH DIFFERENTIAL/PLATELET
Abs Immature Granulocytes: 0.06 10*3/uL (ref 0.00–0.07)
Abs Immature Granulocytes: 0.04 10*3/uL (ref 0.00–0.07)
Basophils Absolute: 0 10*3/uL (ref 0.0–0.1)
Basophils Absolute: 0 10*3/uL (ref 0.0–0.1)
Basophils Relative: 0 %
Basophils Relative: 0 %
Eosinophils Absolute: 0 10*3/uL (ref 0.0–0.5)
Eosinophils Absolute: 0 10*3/uL (ref 0.0–0.5)
Eosinophils Relative: 0 %
Eosinophils Relative: 0 %
HCT: 35.3 % — ABNORMAL LOW (ref 36.0–46.0)
HCT: 34.3 % — ABNORMAL LOW (ref 36.0–46.0)
Hemoglobin: 11.7 g/dL — ABNORMAL LOW (ref 12.0–15.0)
Hemoglobin: 11.1 g/dL — ABNORMAL LOW (ref 12.0–15.0)
Immature Granulocytes: 0 %
Immature Granulocytes: 0 %
Lymphocytes Relative: 18 %
Lymphocytes Relative: 9 %
Lymphs Abs: 3.1 10*3/uL (ref 0.7–4.0)
Lymphs Abs: 0.9 10*3/uL (ref 0.7–4.0)
MCH: 29.5 pg (ref 26.0–34.0)
MCH: 29.3 pg (ref 26.0–34.0)
MCHC: 33.1 g/dL (ref 30.0–36.0)
MCHC: 32.4 g/dL (ref 30.0–36.0)
MCV: 89.1 fL (ref 80.0–100.0)
MCV: 90.5 fL (ref 80.0–100.0)
Monocytes Absolute: 1.1 10*3/uL — ABNORMAL HIGH (ref 0.1–1.0)
Monocytes Absolute: 0 10*3/uL — ABNORMAL LOW (ref 0.1–1.0)
Monocytes Relative: 6 %
Monocytes Relative: 0 %
Neutro Abs: 12.8 10*3/uL — ABNORMAL HIGH (ref 1.7–7.7)
Neutro Abs: 8.9 10*3/uL — ABNORMAL HIGH (ref 1.7–7.7)
Neutrophils Relative %: 76 %
Neutrophils Relative %: 91 %
Platelets: 272 10*3/uL (ref 150–400)
Platelets: 280 10*3/uL (ref 150–400)
RBC: 3.96 MIL/uL (ref 3.87–5.11)
RBC: 3.79 MIL/uL — ABNORMAL LOW (ref 3.87–5.11)
RDW: 12.5 % (ref 11.5–15.5)
RDW: 12.7 % (ref 11.5–15.5)
WBC: 17.1 10*3/uL — ABNORMAL HIGH (ref 4.0–10.5)
WBC: 9.9 10*3/uL (ref 4.0–10.5)
nRBC: 0 % (ref 0.0–0.2)
nRBC: 0 % (ref 0.0–0.2)

## 2022-08-30 LAB — BLOOD GAS, VENOUS
Acid-Base Excess: 6.1 mmol/L — ABNORMAL HIGH (ref 0.0–2.0)
Bicarbonate: 30.6 mmol/L — ABNORMAL HIGH (ref 20.0–28.0)
O2 Saturation: 49.7 %
Patient temperature: 37
pCO2, Ven: 43 mmHg — ABNORMAL LOW (ref 44–60)
pH, Ven: 7.46 — ABNORMAL HIGH (ref 7.25–7.43)
pO2, Ven: 31 mmHg — CL (ref 32–45)

## 2022-08-30 LAB — RESP PANEL BY RT-PCR (FLU A&B, COVID) ARPGX2
Influenza A by PCR: NEGATIVE
Influenza B by PCR: NEGATIVE
SARS Coronavirus 2 by RT PCR: NEGATIVE

## 2022-08-30 LAB — PHOSPHORUS: Phosphorus: 1.3 mg/dL — ABNORMAL LOW (ref 2.5–4.6)

## 2022-08-30 LAB — MAGNESIUM: Magnesium: 1.9 mg/dL (ref 1.7–2.4)

## 2022-08-30 LAB — TROPONIN I (HIGH SENSITIVITY)
Troponin I (High Sensitivity): 11 ng/L (ref <18)
Troponin I (High Sensitivity): 9 ng/L (ref <18)

## 2022-08-30 LAB — CK: Total CK: 28 U/L — ABNORMAL LOW (ref 38–234)

## 2022-08-30 LAB — TSH: TSH: <0.01 u[IU]/mL — ABNORMAL LOW (ref 0.350–4.500)

## 2022-08-30 LAB — BRAIN NATRIURETIC PEPTIDE: B Natriuretic Peptide: 125.5 pg/mL — ABNORMAL HIGH (ref 0.0–100.0)

## 2022-08-30 LAB — PROCALCITONIN: Procalcitonin: 0.55 ng/mL

## 2022-08-30 LAB — LACTIC ACID, PLASMA: Lactic Acid, Venous: 2.9 mmol/L (ref 0.5–1.9)

## 2022-08-30 MED ORDER — SODIUM CHLORIDE 0.9 % IV BOLUS
250.0000 mL | Freq: Once | INTRAVENOUS | Status: AC
  Administered 2022-08-30: 250 mL via INTRAVENOUS

## 2022-08-30 MED ORDER — ACETAMINOPHEN 650 MG RE SUPP: 650.0000 mg | Freq: Four times a day (QID) | RECTAL | Status: DC | PRN

## 2022-08-30 MED ORDER — IPRATROPIUM-ALBUTEROL 0.5-2.5 (3) MG/3ML IN SOLN
3.0000 mL | Freq: Four times a day (QID) | RESPIRATORY_TRACT | Status: DC
  Administered 2022-08-31: 3 mL via RESPIRATORY_TRACT
  Filled 2022-08-30: qty 3

## 2022-08-30 MED ORDER — SODIUM CHLORIDE 0.9 % IV SOLN
500.0000 mg | Freq: Once | INTRAVENOUS | Status: AC
  Administered 2022-08-30: 500 mg via INTRAVENOUS
  Filled 2022-08-30: qty 5

## 2022-08-30 MED ORDER — PANTOPRAZOLE SODIUM 40 MG PO TBEC
40.0000 mg | DELAYED_RELEASE_TABLET | Freq: Every day | ORAL | Status: DC
  Administered 2022-08-31: 40 mg via ORAL
  Filled 2022-08-30: qty 1

## 2022-08-30 MED ORDER — AZITHROMYCIN 250 MG PO TABS: ORAL_TABLET | ORAL | 0 refills | Status: DC

## 2022-08-30 MED ORDER — GUAIFENESIN ER 600 MG PO TB12
600.0000 mg | ORAL_TABLET | Freq: Two times a day (BID) | ORAL | Status: DC
  Administered 2022-08-30 – 2022-08-31 (×2): 600 mg via ORAL
  Filled 2022-08-30 (×2): qty 1

## 2022-08-30 MED ORDER — ACETAMINOPHEN 325 MG PO TABS: 650.0000 mg | ORAL_TABLET | Freq: Four times a day (QID) | ORAL | Status: DC | PRN

## 2022-08-30 MED ORDER — METHIMAZOLE 5 MG PO TABS
7.5000 mg | ORAL_TABLET | Freq: Every day | ORAL | Status: DC
  Administered 2022-08-31: 7.5 mg via ORAL
  Filled 2022-08-30 (×2): qty 2

## 2022-08-30 MED ORDER — NICOTINE 21 MG/24HR TD PT24
21.0000 mg | MEDICATED_PATCH | Freq: Every day | TRANSDERMAL | Status: DC
  Administered 2022-08-30 – 2022-08-31 (×2): 21 mg via TRANSDERMAL
  Filled 2022-08-30 (×2): qty 1

## 2022-08-30 MED ORDER — ALBUTEROL SULFATE (2.5 MG/3ML) 0.083% IN NEBU: 2.5000 mg | INHALATION_SOLUTION | RESPIRATORY_TRACT | Status: DC | PRN

## 2022-08-30 MED ORDER — IPRATROPIUM-ALBUTEROL 0.5-2.5 (3) MG/3ML IN SOLN
3.0000 mL | Freq: Once | RESPIRATORY_TRACT | Status: AC
  Administered 2022-08-30: 3 mL via RESPIRATORY_TRACT
  Filled 2022-08-30: qty 3

## 2022-08-30 MED ORDER — POTASSIUM CHLORIDE CRYS ER 20 MEQ PO TBCR
40.0000 meq | EXTENDED_RELEASE_TABLET | Freq: Once | ORAL | Status: AC
  Administered 2022-08-30: 40 meq via ORAL
  Filled 2022-08-30: qty 2

## 2022-08-30 MED ORDER — LACTATED RINGERS IV BOLUS: 250.0000 mL | Freq: Once | INTRAVENOUS | Status: DC

## 2022-08-30 MED ORDER — OMEPRAZOLE MAGNESIUM 20 MG PO TBEC: 20.0000 mg | DELAYED_RELEASE_TABLET | ORAL | Status: DC

## 2022-08-30 MED ORDER — PANTOPRAZOLE SODIUM 40 MG PO TBEC
40.0000 mg | DELAYED_RELEASE_TABLET | Freq: Every evening | ORAL | Status: DC | PRN
  Administered 2022-08-30: 40 mg via ORAL
  Filled 2022-08-30: qty 1

## 2022-08-30 MED ORDER — METHYLPREDNISOLONE SODIUM SUCC 125 MG IJ SOLR
125.0000 mg | Freq: Once | INTRAMUSCULAR | Status: AC
  Administered 2022-08-30: 125 mg via INTRAVENOUS
  Filled 2022-08-30: qty 2

## 2022-08-30 MED ORDER — IOHEXOL 350 MG/ML SOLN
75.0000 mL | Freq: Once | INTRAVENOUS | Status: AC | PRN
  Administered 2022-08-30: 75 mL via INTRAVENOUS

## 2022-08-30 MED ORDER — SODIUM CHLORIDE 0.9 % IV SOLN: 250.0000 mL | INTRAVENOUS | Status: DC | PRN

## 2022-08-30 MED ORDER — ALBUTEROL SULFATE (2.5 MG/3ML) 0.083% IN NEBU
2.5000 mg | INHALATION_SOLUTION | RESPIRATORY_TRACT | Status: DC
  Administered 2022-08-30: 2.5 mg via RESPIRATORY_TRACT

## 2022-08-30 MED ORDER — POTASSIUM CHLORIDE 20 MEQ PO PACK
60.0000 meq | PACK | Freq: Once | ORAL | Status: AC
  Administered 2022-08-30: 60 meq via ORAL
  Filled 2022-08-30: qty 3

## 2022-08-30 MED ORDER — INFLUENZA VAC SPLIT QUAD 0.5 ML IM SUSY
0.5000 mL | PREFILLED_SYRINGE | INTRAMUSCULAR | Status: AC
  Administered 2022-08-31: 0.5 mL via INTRAMUSCULAR
  Filled 2022-08-30: qty 0.5

## 2022-08-30 MED ORDER — POTASSIUM CHLORIDE 10 MEQ/100ML IV SOLN
10.0000 meq | INTRAVENOUS | Status: AC
  Administered 2022-08-30 (×2): 10 meq via INTRAVENOUS
  Filled 2022-08-30 (×2): qty 100

## 2022-08-30 MED ORDER — SODIUM CHLORIDE 0.9 % IV SOLN
1.0000 g | Freq: Once | INTRAVENOUS | Status: AC
  Administered 2022-08-30: 1 g via INTRAVENOUS
  Filled 2022-08-30: qty 10

## 2022-08-30 MED ORDER — SODIUM CHLORIDE 0.9 % IV SOLN
500.0000 mg | INTRAVENOUS | Status: DC
  Filled 2022-08-30: qty 5

## 2022-08-30 MED ORDER — HYDROCODONE-ACETAMINOPHEN 5-325 MG PO TABS
1.0000 | ORAL_TABLET | ORAL | Status: DC | PRN
  Administered 2022-08-30: 2 via ORAL
  Administered 2022-08-31: 1 via ORAL
  Filled 2022-08-30: qty 1
  Filled 2022-08-30: qty 2

## 2022-08-30 MED ORDER — PNEUMOCOCCAL 20-VAL CONJ VACC 0.5 ML IM SUSY
0.5000 mL | PREFILLED_SYRINGE | INTRAMUSCULAR | Status: AC
  Administered 2022-08-31: 0.5 mL via INTRAMUSCULAR
  Filled 2022-08-30: qty 0.5

## 2022-08-30 MED ORDER — ATORVASTATIN CALCIUM 10 MG PO TABS
10.0000 mg | ORAL_TABLET | Freq: Every evening | ORAL | Status: DC
  Administered 2022-08-30: 10 mg via ORAL
  Filled 2022-08-30: qty 1

## 2022-08-30 MED ORDER — POTASSIUM CHLORIDE CRYS ER 20 MEQ PO TBCR: 20.0000 meq | EXTENDED_RELEASE_TABLET | Freq: Two times a day (BID) | ORAL | 0 refills | Status: DC

## 2022-08-30 MED ORDER — ALBUTEROL SULFATE HFA 108 (90 BASE) MCG/ACT IN AERS
2.0000 | INHALATION_SPRAY | RESPIRATORY_TRACT | Status: DC
  Administered 2022-08-30 (×2): 2 via RESPIRATORY_TRACT
  Filled 2022-08-30: qty 6.7

## 2022-08-30 MED ORDER — PREDNISONE 20 MG PO TABS: 40.0000 mg | ORAL_TABLET | Freq: Every day | ORAL | Status: DC

## 2022-08-30 MED ORDER — SODIUM CHLORIDE 0.9 % IV SOLN
1.0000 g | INTRAVENOUS | Status: DC
  Filled 2022-08-30: qty 10

## 2022-08-30 MED ORDER — METHYLPREDNISOLONE SODIUM SUCC 40 MG IJ SOLR
40.0000 mg | Freq: Two times a day (BID) | INTRAMUSCULAR | Status: DC
  Administered 2022-08-31: 40 mg via INTRAVENOUS
  Filled 2022-08-30: qty 1

## 2022-08-30 MED ORDER — CEFDINIR 300 MG PO CAPS: 300.0000 mg | ORAL_CAPSULE | Freq: Two times a day (BID) | ORAL | 0 refills | Status: AC

## 2022-08-30 MED ORDER — SODIUM CHLORIDE 0.9% FLUSH: 3.0000 mL | INTRAVENOUS | Status: DC | PRN

## 2022-08-30 MED ORDER — SODIUM CHLORIDE 0.9% FLUSH
3.0000 mL | Freq: Two times a day (BID) | INTRAVENOUS | Status: DC
  Administered 2022-08-30: 3 mL via INTRAVENOUS

## 2022-08-30 NOTE — ED Provider Triage Note (Signed)
Emergency Medicine Provider Triage Evaluation Note  [name] , a 61 y.o. female  was evaluated in triage.  Pt complains of shortness of breath for the past few days, cough, nasal congestion.  Patient states that she has had associated chest pain with radiation to back that has been present with increasing cough.  She denies any known sickness.  Negative COVID test at home.  She is here by the urgent care due to increased respiratory rate as well as 92% on room air.  Patient denies any at home oxygen use.  History of chronic cigarette use as well as CHF.  Denies any known weight gain or lower extremity swelling, fever, chills, night sweats, nausea, abdominal pain.  Review of Systems  Positive: See above Negative:   Physical Exam  BP (!) 146/63 (BP Location: Left Arm)   Pulse 74   Temp 99.1 F (37.3 C) (Oral)   Resp 18   SpO2 93%  Gen:   Awake, no distress   Resp:  Normal effort  MSK:   Moves extremities without difficulty  Other:  Diffuse rhonchi and mild wheeze upon auscultation of lung.  No lower extremity edema noted.  No obvious murmurs gallops or rubs.  Medical Decision Making  Medically screening exam initiated at 1:38 PM.  Appropriate orders placed.  Alfonzo Feller was informed that the remainder of the evaluation will be completed by another provider, this initial triage assessment does not replace that evaluation, and the importance of remaining in the ED until their evaluation is complete.     Wilnette Kales, [place] 08/30/22 1340

## 2022-08-30 NOTE — H&P (Signed)
[name]:[id number] DOB: 05-29-1961 DOA: 08/30/2022     PCP: Dimple Nanas, MD   Outpatient Specialists:   CARDS:   Dr. Bing Matter   Endocrinology [facility] All Patient arrived to ER on 08/30/22 at 1259 Referred by Attending Therisa Doyne, MD   Patient coming from:    home Lives  With family    Chief Complaint:   Chief Complaint  Patient presents with   Shortness of Breath    HPI: [name] is a 61 y.o. female with medical history significant of diastolic CHF, COPD, history of tobacco abuse, Graves' disease  Presented with   shortness of breath Has been having nasal congestion and shortness of breath for the past 5 days she has history in the past of diastolic heart failure and COPD and poor follow-up.  Patient not on oxygen at home states that COVID test was negative at home Also endorses some cold-like symptoms and nasal congestion some chills. Cough she has tried to use over-the-counter medications including Mucinex does not seem to help.  She was evaluated by urgent care team that recommended for her to go to emergency department By arrival to triage patient appears to be breathing rapidly   Smokes 1 pack per day Does not drink etoh No bleeding Reports occasional twinge in her chest  Strong family hx of CAD No hemoptysis   Initial COVID TEST  NEGATIVE   Lab Results  Component Value Date   SARSCOV2NAA NEGATIVE 08/30/2022   SARSCOV2NAA Not Detected 12/18/2020     Regarding pertinent Chronic problems:    Hyperlipidemia -  on statin atorvastatin  Lipid Panel     Component Value Date/Time   CHOL 157 06/10/2022 1438   TRIG 151 (H) 06/10/2022 1438   HDL 38 (L) 06/10/2022 1438   CHOLHDL 4.1 06/10/2022 1438   CHOLHDL 3.1 02/23/2017 1834   VLDL 15 02/23/2017 1834   LDLCALC 92 06/10/2022 1438   LABVLDL 27 06/10/2022 1438       chronic CHF diastolic  - last [id number] preserved EF and no evidence of diastolic function at that time  Pulmonary  hypertension, unspecified (HCC) 60 mmHg based on echocardiogram from 2018       Hyperthyroidism:  Lab Results  Component Value Date   TSH 0.23 (L) 03/19/2022   on synthroid      COPD -  followed by pulmonology   not  on baseline oxygen       Chronic anemia - baseline hg Hemoglobin & Hematocrit  Recent Labs    08/30/22 1431  HGB 11.7*     While in ER:   Noted to be hypoxic wot mid 80's while ambulating CXR showing small infiltrate      CXR - 1. Opacity at the base of the right middle lobe consistent with bronchopneumonia. 2. No other evidence of acute cardiopulmonary disease.    CTA chest - ordered  Following Medications were ordered in ER: Medications  albuterol (VENTOLIN HFA) 108 (90 Base) MCG/ACT inhaler 2 puff (2 puffs Inhalation Given 08/30/22 1558)  potassium chloride 10 mEq in 100 mL IVPB (10 mEq Intravenous New Bag/Given 08/30/22 1757)  ipratropium-albuterol (DUONEB) 0.5-2.5 (3) MG/3ML nebulizer solution 3 mL (3 mLs Nebulization Given 08/30/22 1426)  ipratropium-albuterol (DUONEB) 0.5-2.5 (3) MG/3ML nebulizer solution 3 mL (3 mLs Nebulization Given 08/30/22 1436)  methylPREDNISolone sodium succinate (SOLU-MEDROL) 125 mg/2 mL injection 125 mg (125 mg Intravenous Given 08/30/22 1435)  cefTRIAXone (ROCEPHIN) 1 g in sodium chloride 0.9 %  100 mL IVPB (0 g Intravenous Stopped 08/30/22 1628)  azithromycin (ZITHROMAX) 500 mg in sodium chloride 0.9 % 250 mL IVPB (0 mg Intravenous Stopped 08/30/22 1756)  potassium chloride SA (KLOR-CON M) CR tablet 40 mEq (40 mEq Oral Given 08/30/22 1648)       ED Triage Vitals  Enc Vitals Group     BP 08/30/22 1324 (!) 146/63     Pulse Rate 08/30/22 1324 74     Resp 08/30/22 1324 18     Temp 08/30/22 1324 99.1 F (37.3 C)     Temp Source 08/30/22 1324 Oral     SpO2 08/30/22 1324 93 %     Weight --      Height --      Head Circumference --      Peak Flow --      Pain Score 08/30/22 1330 8     Pain Loc --      Pain Edu? --      Excl.  in GC? --   TMAX(24)@     _________________________________________ Significant initial  Findings: Abnormal Labs Reviewed  COMPREHENSIVE METABOLIC PANEL - Abnormal; Notable for the following components:      Result Value   Potassium 2.4 (*)    Glucose, Bld 132 (*)    Creatinine, Ser 0.40 (*)    Albumin 3.4 (*)    AST 11 (*)    All other components within normal limits  CBC WITH DIFFERENTIAL/PLATELET - Abnormal; Notable for the following components:   WBC 17.1 (*)    Hemoglobin 11.7 (*)    HCT 35.3 (*)    Neutro Abs 12.8 (*)    Monocytes Absolute 1.1 (*)    All other components within normal limits  BRAIN NATRIURETIC PEPTIDE - Abnormal; Notable for the following components:   B Natriuretic Peptide 125.5 (*)    All other components within normal limits  BLOOD GAS, VENOUS - Abnormal; Notable for the following components:   pH, Ven 7.46 (*)    pCO2, Ven 43 (*)    pO2, Ven 31 (*)    Bicarbonate 30.6 (*)    Acid-Base Excess 6.1 (*)    All other components within normal limits     _________________________ Troponin 9  ECG: Ordered Personally reviewed and interpreted by me showing: HR : 74 Rhythm:  Sinus rhythm Multiple premature complexes, vent & supraven Borderline right axis deviation Minimal ST depression, inferior leads Baseline wander in lead(s) V1 QTC 431   ____________________ This patient meets SIRS Criteria and may be septic.     The recent clinical data is shown below. Vitals:   08/30/22 1500 08/30/22 1630 08/30/22 1723 08/30/22 1800  BP: (!) 156/66 (!) 151/61 (!) 158/56 (!) 144/66  Pulse: 90 99 98 90  Resp: 20 (!) 25 (!) 24 16  Temp:   99.6 F (37.6 C)   TempSrc:   Oral   SpO2: 95% 93% 93% 92%      WBC     Component Value Date/Time   WBC 17.1 (H) 08/30/2022 1431   LYMPHSABS 3.1 08/30/2022 1431   MONOABS 1.1 (H) 08/30/2022 1431   EOSABS 0.0 08/30/2022 1431   BASOSABS 0.0 08/30/2022 1431      Procalcitonin   Ordered    Results for orders  placed or performed during the hospital encounter of 08/30/22  Resp Panel by RT-PCR (Flu A&B, Covid) Anterior Nasal Swab     Status: None   Collection Time: 08/30/22  2:31 PM  Specimen: Anterior Nasal Swab  Result Value Ref Range Status   SARS Coronavirus 2 by RT PCR NEGATIVE NEGATIVE Final         Influenza A by PCR NEGATIVE NEGATIVE Final   Influenza B by PCR NEGATIVE NEGATIVE Final          _______________________________________________ Hospitalist was called for admission for   COPD exacerbation (HCC)   Pneumonia of right lower lobe due to infectious organism    The following Work up has been ordered so far:  Orders Placed This Encounter  Procedures   Resp Panel by RT-PCR (Flu A&B, Covid) Anterior Nasal Swab   DG Chest 2 View   CT Angio Chest Pulmonary Embolism (PE) W or WO Contrast   Comprehensive metabolic panel   CBC with Differential   Brain natriuretic peptide   Blood gas, venous   Magnesium   Cardiac monitoring   Consult to hospitalist   Consult to hospitalist   ED EKG   Admit to Inpatient (patient's expected length of stay will be greater than 2 midnights or inpatient only procedure)     OTHER Significant initial  Findings:  labs showing:    Recent Labs  Lab 08/30/22 1431 08/30/22 1800  NA 140  --   K 2.4*  --   CO2 29  --   GLUCOSE 132*  --   BUN 12  --   CREATININE 0.40*  --   CALCIUM 8.9  --   MG  --  1.9    Cr  stable,    Lab Results  Component Value Date   CREATININE 0.40 (L) 08/30/2022   CREATININE 0.66 05/13/2021   CREATININE 0.29 (L) 04/27/2017    Recent Labs  Lab 08/30/22 1431  AST 11*  ALT 10  ALKPHOS 89  BILITOT 0.7  PROT 6.9  ALBUMIN 3.4*   Lab Results  Component Value Date   CALCIUM 8.9 08/30/2022   PHOS 5.5 (H) 02/26/2017          Plt: Lab Results  Component Value Date   PLT 272 08/30/2022         Venous  Blood Gas result:  pH  7.46 High  Acid-Base Excess 6.1 High  mmol/L  pCO2, Ven 43 Low  mmHg O2  Saturation 49.7 %  pO2, Ven 31 Low Panic  mmHg       ABG    Component Value Date/Time   HCO3 30.6 (H) 08/30/2022 1431   TCO2 19 10/31/2008 2001   O2SAT 49.7 08/30/2022 1431         Recent Labs  Lab 08/30/22 1431  WBC 17.1*  NEUTROABS 12.8*  HGB 11.7*  HCT 35.3*  MCV 89.1  PLT 272    HG/HCT  stable,      Component Value Date/Time   HGB 11.7 (L) 08/30/2022 1431   HCT 35.3 (L) 08/30/2022 1431   MCV 89.1 08/30/2022 1431      BNP (last 3 results) Recent Labs    08/30/22 1431  BNP 125.5*      Cultures:    Component Value Date/Time   SDES EXPECTORATED SPUTUM 02/23/2017 1642   SDES EXPECTORATED SPUTUM 02/23/2017 1642   SPECREQUEST NONE 02/23/2017 1642   SPECREQUEST NONE Reflexed from [id number] 02/23/2017 1642   CULT Consistent with normal respiratory flora. 02/23/2017 1642   REPTSTATUS 02/26/2017 FINAL 02/23/2017 1642   REPTSTATUS 02/28/2017 FINAL 02/23/2017 1642     Radiological Exams on Admission: DG Chest 2 View  Result Date: 08/30/2022  CLINICAL DATA:  Productive cough.  Short of breath for several days. EXAM: CHEST - 2 VIEW COMPARISON:  05/13/2021.  CT, 08/27/2021. FINDINGS: Cardiac silhouette is normal in size and configuration. No mediastinal or hilar masses. No evidence of adenopathy. Streaky opacity noted at the anterior right lung base, increased compared to the prior chest radiographs. Mild stable scarring at the apices. Remainder of the lungs is clear. No pleural effusion or pneumothorax. Skeletal structures are intact. IMPRESSION: 1. Opacity at the base of the right middle lobe consistent with bronchopneumonia. 2. No other evidence of acute cardiopulmonary disease. Electronically Signed   By: Amie Portland M.D.   On: 08/30/2022 14:12   _______________________________________________________________________________________________________ Latest  Blood pressure (!) 144/66, pulse 90, temperature 99.6 F (37.6 C), temperature source Oral, resp. rate 16, SpO2 92  %.   Vitals  labs and radiology finding personally reviewed  Review of Systems:    Pertinent positives include:   chills, fatigue,  shortness of breath at rest.  dyspnea on exertion productive cough,  wheezing. Constitutional:  No weight loss, night sweats, Fevers, weight loss  HEENT:  No headaches, Difficulty swallowing,Tooth/dental problems,Sore throat,  No sneezing, itching, ear ache, nasal congestion, post nasal drip,  Cardio-vascular:  No chest pain, Orthopnea, PND, anasarca, dizziness, palpitations.no Bilateral lower extremity swelling  GI:  No heartburn, indigestion, abdominal pain, nausea, vomiting, diarrhea, change in bowel habits, loss of appetite, melena, blood in stool, hematemesis Resp:  no , No excess mucus, no No non-productive cough, No coughing up of blood.No change in color of mucus.No Skin:  no rash or lesions. No jaundice GU:  no dysuria, change in color of urine, no urgency or frequency. No straining to urinate.  No flank pain.  Musculoskeletal:  No joint pain or no joint swelling. No decreased range of motion. No back pain.  Psych:  No change in mood or affect. No depression or anxiety. No memory loss.  Neuro: no localizing neurological complaints, no tingling, no weakness, no double vision, no gait abnormality, no slurred speech, no confusion  All systems reviewed and apart from HOPI all are negative _______________________________________________________________________________________________ Past Medical History:   Past Medical History:  Diagnosis Date   CHF (congestive heart failure) (HCC)    Graves disease    Herniated disc       Past Surgical History:  Procedure Laterality Date   back L4-5 herniation repair     BUNIONECTOMY     CARPAL TUNNEL RELEASE Right    CATARACT EXTRACTION, BILATERAL     plate and screws of R foot/arm      Social History:  Ambulatory   independently        reports that she has been smoking cigarettes. She started  smoking about 44 years ago. She has a 43.00 pack-year smoking history. She has never used smokeless tobacco. She reports that she does not drink alcohol and does not use drugs.    Family History:   Family History  Problem Relation Age of Onset   Thyroid disease Neg Hx    ______________________________________________________________________________________________ Allergies: Allergies  Allergen Reactions   Other Rash and Other (See Comments)    Per pt, an unnamed antibiotic used to treat MRSA caused a rash     Prior to Admission medications   Medication Sig Start Date End Date Taking? Authorizing Provider  atorvastatin (LIPITOR) 10 MG tablet Take 1 tablet (10 mg total) by mouth daily. Patient taking differently: Take 10 mg by mouth every evening. 07/29/21 08/30/22 Yes Bing Matter,  Marveen Reeksobert J, MD  azithromycin (ZITHROMAX) 250 MG tablet Take 1 pill daily for 3 days starting 08/31/22 08/30/22  Yes Glyn Adeountryman, Chase, MD  cefdinir (OMNICEF) 300 MG capsule Take 1 capsule (300 mg total) by mouth 2 (two) times daily for 5 days. 08/30/22 09/04/22 Yes Countryman, Almeta Monashase, MD  cetirizine (ZYRTEC) 10 MG tablet Take 10 mg by mouth daily.   Yes [provider]  EXCEDRIN MIGRAINE [phone] MG tablet Take 1 tablet by mouth every 6 (six) hours as needed for headache.   Yes [provider]  guaiFENesin (MUCINEX) 600 MG 12 hr tablet Take 600 mg by mouth 2 (two) times daily as needed for to loosen phlegm or cough.   Yes [provider]  methimazole (TAPAZOLE) 5 MG tablet Take 1.5 tablets (7.5 mg total) by mouth daily. Patient taking differently: Take 7.5 mg by mouth daily before breakfast. 03/19/22  Yes Romero BellingEllison, Sean, MD  omeprazole (PRILOSEC OTC) 20 MG tablet Take 20 mg by mouth See admin instructions. Take 20 mg by mouth every morning before breakfast and an additional 20 mg at bedtime as needed for unresolved reflux   Yes [provider]  potassium chloride SA (KLOR-CON M) 20 MEQ  tablet Take 1 tablet (20 mEq total) by mouth 2 (two) times daily for 5 days. 08/30/22 09/04/22 Yes Countryman, Almeta Monashase, MD  VICKS DAYQUIL SEVERE COLD/FLU 5-10-200-325 MG CAPS Take 1 capsule by mouth every 6 (six) hours as needed (for congestion/cold-like symptoms).   Yes [provider]  albuterol (VENTOLIN HFA) 108 (90 Base) MCG/ACT inhaler Inhale 2 puffs into the lungs every 4 (four) hours as needed for wheezing or shortness of breath. Patient not taking: Reported on 08/30/2022 02/13/21   Moshe CiproMatthews, Stephanie, NP    ___________________________________________________________________________________________________ Physical Exam:    08/30/2022    6:00 PM 08/30/2022    5:23 PM 08/30/2022    4:30 PM  Vitals with BMI  Systolic 144 [id number]  Diastolic 66 56 61  Pulse 90 98 99     1. General:  in No  Acute distress   Chronically ill   -appearing 2. Psychological: Alert and   Oriented 3. Head/ENT:    Dry Mucous Membranes                          Head Non traumatic, neck supple                         Poor Dentition 4. SKIN:  decreased Skin turgor,  Skin clean Dry and intact no rash 5. Heart: Regular rate and rhythm no  Murmur, no Rub or gallop 6. Lungs:  no wheezes  some crackles   7. Abdomen: Soft,  non-tender, Non distended   obese  bowel sounds present 8. Lower extremities: no clubbing, cyanosis, no  edema 9. Neurologically Grossly intact, moving all 4 extremities equally   10. MSK: Normal range of motion    Chart has been reviewed  ______________________________________________________________________________________________  Assessment/Plan 61 y.o. female with medical history significant of diastolic CHF, COPD, history of tobacco abuse, Graves' disease   Admitted for   COPD exacerbation (HCC)  Pneumonia of right lower lobe due to infectious organism     Present on Admission:  CAP (community acquired pneumonia)  Hypokalemia  Hyperthyroidism  COPD with acute exacerbation  (HCC)  Acute respiratory failure with hypoxia (HCC)     CAP (community acquired pneumonia)  - -Patient presenting with  productive cough  Hypoxia  and infiltrate in   lower lobe on chest x-ray -Infiltrate on CXR and 2-3 characteristics (fever, leukocytosis, purulent sputum) are consistent with pneumonia. -This appears to be most likely community-acquired pneumonia.        will admit for treatment of CAP will start on appropriate antibiotic coverage. - Rocephin/azithromycin   Obtain:  sputum cultures,                Obtain respiratory panel                                   COVID PCR negative                   blood cultures and sputum cultures ordered                   strep pneumo UA antigen,                   check for Legionella antigen.                Provide oxygen as needed.     Hypokalemia Will replace and recheck  Hyperthyroidism Recheck TSH Restart tapazole 5 mg po q day  COPD with acute exacerbation (HCC)  -  - Will initiate: Steroid taper  -  Antibiotics rocephin azithro - Albuterol  PRN, - scheduled duoneb,  -  Breo or Dulera at discharge   -  Mucinex.  Titrate O2 to saturation >90%. Follow patients respiratory status.  Order nfluenza PCR   VBG no hyperrcapnea    Acute respiratory failure with hypoxia (HCC)  this patient has acute respiratory failure with Hypoxia  as documented by the presence of following: O2 saturatio< 90% on RA  Likely due to:  Pneumonia,  , COPD exacerbation,  Provide O2 therapy and titrate as needed  Continuous pulse ox   check Pulse ox with ambulation prior to discharge   may need  TC consult for home O2 set up  flutter valve ordered  CTA pending at this time  Other plan as per orders.  DVT prophylaxis:  SCD     Code Status:    Code Status: Prior FULL CODE   as per patient   I had personally discussed CODE STATUS with patient     Family Communication:   Family not at  Bedside    Disposition Plan:      To home once  workup is complete and patient is stable   Following barriers for discharge:                            Electrolytes corrected                                                         Will need consultants to evaluate patient prior to discharge                      Would benefit from PT/OT eval prior to DC  Ordered  Transition of care consulted                   Nutrition    consulted              Consults called: none  Admission status:  ED Disposition     ED Disposition  Admit   Condition  --   Comment  Hospital Area: [facility] [[id number]]  Level of Care: Telemetry [5]  Admit to tele based on following criteria: Other see comments  Comments: hypokalemia  May admit patient to [facility] or [facility] if equivalent level of care is available:: No  Covid Evaluation: Asymptomatic - no recent exposure (last 10 days) testing not required  Diagnosis: CAP (community acquired pneumonia) [161096]  Admitting Physician: Therisa Doyne [[id number]]  Attending Physician: Therisa Doyne [[id number]]  Certification:: I certify this patient will need inpatient services for at least 2 midnights  Estimated Length of Stay: 2           inpatient     I Expect 2 midnight stay secondary to severity of patient's current illness need for inpatient interventions justified by the following:  hemodynamic instability despite optimal treatment (tachycardia hypoxia,  )  Severe lab/radiological/exam abnormalities including:    CAP on CXR and extensive comorbidities including:   COPD/asthma   That are currently affecting medical management.   I expect  patient to be hospitalized for 2 midnights requiring inpatient medical care.  Patient is at high risk for adverse outcome (such as loss of life or disability) if not treated.  Indication for inpatient stay as follows:    New or worsening hypoxia   Need for IV antibiotics, IV fluids,       Level of care     tele  For 12H   Lab Results  Component Value Date   SARSCOV2NAA NEGATIVE 08/30/2022     Precautions: admitted as   Covid Negative        Willys Salvino 08/30/2022, 7:58 PM    [facility]     after 2 AM please page floor coverage PA If 7AM-7PM, please contact the day team taking care of the patient using [email or web address]   Patient was evaluated in the context of the global COVID-19 pandemic, which necessitated consideration that the patient might be at risk for infection with the SARS-CoV-2 virus that causes COVID-19. Institutional protocols and algorithms that pertain to the evaluation of patients at risk for COVID-19 are in a state of rapid change based on information released by regulatory bodies including the CDC and federal and state organizations. These policies and algorithms were followed during the patient's care.

## 2022-08-30 NOTE — Assessment & Plan Note (Addendum)
Recheck TSH Restart tapazole 5 mg po q day

## 2022-08-30 NOTE — ED Triage Notes (Addendum)
Pt reports a cough, nasal congestion and shortness of breath the past 4/5 days. Has been taking Mucinex and Dayquil/Nyquil. Has hx of CHF, does not wear o2. Satting 92% on room air.

## 2022-08-30 NOTE — ED Triage Notes (Signed)
Pt presents with c/o shortness of breath for several days. Pt is visibly short of breath in triage. Pt reports she has pain in her chest radiating to her back. Pt reports she took a covid test at home that was negative.

## 2022-08-30 NOTE — Subjective & Objective (Signed)
Has been having nasal congestion and shortness of breath for the past 5 days she has history in the past of diastolic heart failure and COPD and poor follow-up.  Patient not on oxygen at home states that COVID test was negative at home Also endorses some cold-like symptoms and nasal congestion some chills. Cough she has tried to use over-the-counter medications including Mucinex does not seem to help.  She was evaluated by urgent care team that recommended for her to go to emergency department By arrival to triage patient appears to be breathing rapidly

## 2022-08-30 NOTE — ED Provider Notes (Signed)
[facility] DEPT Provider Note   CSN: [id number] Arrival date & time: 08/30/22  1259     History Chief Complaint  Patient presents with   Shortness of Breath    HPI [name] is a 61 y.o. female presenting for shortness of breath.  Patient has a history of hypertension, hyperlipidemia, heart failure and a 40-pack-year smoking history.  She endorses that she has been wheezing, feeling short of breath for the last 3 days.  She endorses concurrent cough and sinus drainage. Tested negative for COVID this morning.  Patient is ambulatory tolerating p.o. intake.  Tachypneic.  Nonproductive cough this time. No known sick contacts.  Says that she had pulmonary function test done last year concerning for emphysema but is not on any inhalers at this time.   Patient's recorded medical, surgical, social, medication list and allergies were reviewed in the Snapshot window as part of the initial history.   Review of Systems   Review of Systems  Constitutional:  Negative for chills and fever.  HENT:  Negative for ear pain and sore throat.   Eyes:  Negative for pain and visual disturbance.  Respiratory:  Positive for cough, shortness of breath and wheezing.   Cardiovascular:  Negative for chest pain and palpitations.  Gastrointestinal:  Negative for abdominal pain and vomiting.  Genitourinary:  Negative for dysuria and hematuria.  Musculoskeletal:  Negative for arthralgias and back pain.  Skin:  Negative for color change and rash.  Neurological:  Negative for seizures and syncope.  All other systems reviewed and are negative.   Physical Exam Updated Vital Signs BP (!) 161/72 (BP Location: Right Arm)   Pulse 98   Temp 98.5 F (36.9 C) (Oral)   Resp 17   SpO2 98%  Physical Exam Vitals and nursing note reviewed.  Constitutional:      General: She is not in acute distress.    Appearance: She is well-developed.  HENT:     Head: Normocephalic and  atraumatic.  Eyes:     Conjunctiva/sclera: Conjunctivae normal.  Cardiovascular:     Rate and Rhythm: Normal rate and regular rhythm.     Heart sounds: No murmur heard. Pulmonary:     Effort: Pulmonary effort is normal. No respiratory distress.     Breath sounds: Wheezing present.  Abdominal:     Palpations: Abdomen is soft.     Tenderness: There is no abdominal tenderness.  Musculoskeletal:        General: No swelling.     Cervical back: Neck supple.  Skin:    General: Skin is warm and dry.     Capillary Refill: Capillary refill takes less than 2 seconds.  Neurological:     Mental Status: She is alert.  Psychiatric:        Mood and Affect: Mood normal.      ED Course/ Medical Decision Making/ A&P    Procedures Procedures   Medications Ordered in ED Medications  azithromycin (ZITHROMAX) 500 mg in sodium chloride 0.9 % 250 mL IVPB (has no administration in time range)  cefTRIAXone (ROCEPHIN) 1 g in sodium chloride 0.9 % 100 mL IVPB (has no administration in time range)  ipratropium-albuterol (DUONEB) 0.5-2.5 (3) MG/3ML nebulizer solution 3 mL (3 mLs Nebulization Not Given 08/30/22 2141)  atorvastatin (LIPITOR) tablet 10 mg (has no administration in time range)  methimazole (TAPAZOLE) tablet 7.5 mg (has no administration in time range)  acetaminophen (TYLENOL) tablet 650 mg (has no administration in time  range)    Or  acetaminophen (TYLENOL) suppository 650 mg (has no administration in time range)  HYDROcodone-acetaminophen (NORCO/VICODIN) 5-325 MG per tablet 1-2 tablet (has no administration in time range)  methylPREDNISolone sodium succinate (SOLU-MEDROL) 40 mg/mL injection 40 mg (has no administration in time range)    Followed by  predniSONE (DELTASONE) tablet 40 mg (has no administration in time range)  sodium chloride flush (NS) 0.9 % injection 3 mL (has no administration in time range)  sodium chloride flush (NS) 0.9 % injection 3 mL (has no administration in time  range)  0.9 %  sodium chloride infusion (has no administration in time range)  guaiFENesin (MUCINEX) 12 hr tablet 600 mg (has no administration in time range)  nicotine (NICODERM CQ - dosed in mg/24 hours) patch 21 mg (has no administration in time range)  pneumococcal 20-valent conjugate vaccine (PREVNAR 20) injection 0.5 mL (has no administration in time range)  influenza vac split quadrivalent PF (FLUARIX) injection 0.5 mL (has no administration in time range)  pantoprazole (PROTONIX) EC tablet 40 mg (has no administration in time range)    And  pantoprazole (PROTONIX) EC tablet 40 mg (has no administration in time range)  albuterol (PROVENTIL) (2.5 MG/3ML) 0.083% nebulizer solution 2.5 mg (has no administration in time range)  potassium chloride (KLOR-CON) packet 60 mEq (has no administration in time range)  lactated ringers bolus 250 mL (has no administration in time range)  ipratropium-albuterol (DUONEB) 0.5-2.5 (3) MG/3ML nebulizer solution 3 mL (3 mLs Nebulization Given 08/30/22 1426)  ipratropium-albuterol (DUONEB) 0.5-2.5 (3) MG/3ML nebulizer solution 3 mL (3 mLs Nebulization Given 08/30/22 1436)  methylPREDNISolone sodium succinate (SOLU-MEDROL) 125 mg/2 mL injection 125 mg (125 mg Intravenous Given 08/30/22 1435)  cefTRIAXone (ROCEPHIN) 1 g in sodium chloride 0.9 % 100 mL IVPB (0 g Intravenous Stopped 08/30/22 1628)  azithromycin (ZITHROMAX) 500 mg in sodium chloride 0.9 % 250 mL IVPB (0 mg Intravenous Stopped 08/30/22 1756)  potassium chloride 10 mEq in 100 mL IVPB (0 mEq Intravenous Stopped 08/30/22 1908)  potassium chloride SA (KLOR-CON M) CR tablet 40 mEq (40 mEq Oral Given 08/30/22 1648)  iohexol (OMNIPAQUE) 350 MG/ML injection 75 mL (75 mLs Intravenous Contrast Given 08/30/22 1924)    Medical Decision Making:    [name] is a 61 y.o. female who presented to the ED today with SOIB detailed above.     Patient's presentation is complicated by their history of multiple comorbid  medical problems including hypertension, hyperlipidemia on chronic outpatient medication regimen.  Patient placed on continuous vitals and telemetry monitoring while in ED which was reviewed periodically.   Complete initial physical exam performed, notably the patient  was hemodynamically stable in no acute distress.      Reviewed and confirmed nursing documentation for past medical history, family history, social history.    Initial Assessment:   With the patient's presentation of shortness of breath, most likely diagnosis is COPD exacerbation likely triggered by viral respiratory illness. Other diagnoses were considered including (but not limited to) community-acquired ammonia, pneumothorax, ACS, pulmonary realism. These are considered less likely due to history of present illness and physical exam findings.   This is most consistent with an acute life/limb threatening illness complicated by underlying chronic conditions.  Initial Plan:  We will initiate empiric treatment for patient's wheezing in the upper lung fields withNonspecific gastroenteritis bronchodilators including ipratropium, albuterol.  Additionally will treat with Medrol 125 mg.  We will plan for reassessment after all treatments. Screening labs including  CBC and Metabolic panel to evaluate for infectious or metabolic etiology of disease.  We will evaluate for severity of respiratory disease with venous blood gas  We will evaluate for heart failure exacerbation with BNP CXR to evaluate for structural/infectious intrathoracic pathology.  Troponin and EKG to evaluate for cardiac pathology.  Case is grossly inconsistent with acute coronary syndrome. Objective evaluation as below reviewed with plan for close reassessment  Initial Study Results:   Laboratory  All laboratory results reviewed without evidence of clinically relevant pathology.     EKG EKG was reviewed independently. Rate, rhythm, axis, intervals all examined and  without medically relevant abnormality. ST segments without concerns for elevations.    Radiology  All images reviewed independently. Agree with radiology report at this time.   CT Angio Chest Pulmonary Embolism (PE) W or WO Contrast  Result Date: 08/30/2022 CLINICAL DATA:  PE suspected. Shortness of breath for the past few days and cough. EXAM: CT ANGIOGRAPHY CHEST WITH CONTRAST TECHNIQUE: Multidetector CT imaging of the chest was performed using the standard protocol during bolus administration of intravenous contrast. Multiplanar CT image reconstructions and MIPs were obtained to evaluate the vascular anatomy. RADIATION DOSE REDUCTION: This exam was performed according to the departmental dose-optimization program which includes automated exposure control, adjustment of the mA and/or kV according to patient size and/or use of iterative reconstruction technique. CONTRAST:  65mL OMNIPAQUE IOHEXOL 350 MG/ML SOLN COMPARISON:  CT chest 08/27/2021 FINDINGS: Cardiovascular: Satisfactory opacification of the pulmonary arteries to the segmental level. No evidence of pulmonary embolism. Normal heart size. No pericardial effusion. Scattered aortic calcific atherosclerosis. Mediastinum/Nodes: An enlarged right hilar lymph node measures 1 cm short axis (series 5, image 102). Prominent pretracheal lymph node is unchanged compared to prior CT 08/27/2021 (series 5, image 84). No enlarged axillary lymph nodes. The trachea and esophagus demonstrate no significant findings. Stable enlarged thyroid. Lungs/Pleura: Upper lobe predominant paraseptal and centrilobular emphysema with apical scarring, similar to prior exams. Small patches of peripheral ground-glass opacities noted in the throughout the right lung. Subsegmental atelectasis in the lingula. Left lung is otherwise clear. No pleural effusion or pneumothorax. Upper Abdomen: No acute abnormality. Musculoskeletal: No chest wall abnormality. No acute or suspicious osseous  findings. Review of the MIP images confirms the above findings. IMPRESSION: 1. No pulmonary embolism. 2. Small patches of peripheral ground-glass opacities are noted throughout the right lung, favored to represent infection including viral etiologies such as COVID-19. 3. Chronic upper lobe predominant emphysema with apical scarring. 4.  Aortic Atherosclerosis (ICD10-I70.0). 5. Stable thyromegaly. Electronically Signed   By: Ileana Roup M.D.   On: 08/30/2022 20:10   DG Chest 2 View  Result Date: 08/30/2022 CLINICAL DATA:  Productive cough.  Short of breath for several days. EXAM: CHEST - 2 VIEW COMPARISON:  05/13/2021.  CT, 08/27/2021. FINDINGS: Cardiac silhouette is normal in size and configuration. No mediastinal or hilar masses. No evidence of adenopathy. Streaky opacity noted at the anterior right lung base, increased compared to the prior chest radiographs. Mild stable scarring at the apices. Remainder of the lungs is clear. No pleural effusion or pneumothorax. Skeletal structures are intact. IMPRESSION: 1. Opacity at the base of the right middle lobe consistent with bronchopneumonia. 2. No other evidence of acute cardiopulmonary disease. Electronically Signed   By: Lajean Manes M.D.   On: 08/30/2022 14:12     Final Assessment and Plan:   Reassessed [name] at bedside.  She is endorsing substantial improvement of her shortness of breath.[place] Kitchen  Discussed her multiple abnormalities including hypokalemia, pneumonia, leukocytosis and recommended admission.  She stated she would like to be discharged.  She was consented for initiation of therapy in the emergency department with IV ceftriaxone and azithromycin with plan to transition to p.o.  She was informed of her hypokalemia and risk for poor cardiac outcomes.   Initially, patient again insisted on discharge.  However she got up to go to the bathroom and began to feel acutely worsening from a shortness of breath standpoint.  She was found to be satting  roughly 86% with exertion and started on 2 L nasal cannula with substantial symptomatic improvement.  After this finding, she is now willing to be admitted for ongoing care and management.     Clinical Impression:  1. COPD exacerbation ([place])   2. Pneumonia of right lower lobe due to infectious organism      Admit   Final Clinical Impression(s) / ED Diagnoses Final diagnoses:  COPD exacerbation ([place])  Pneumonia of right lower lobe due to infectious organism    Rx / DC Orders ED Discharge Orders          Ordered    cefdinir (OMNICEF) 300 MG capsule  2 times daily        08/30/22 1740    azithromycin (ZITHROMAX) 250 MG tablet        08/30/22 1740    potassium chloride SA (KLOR-CON M) 20 MEQ tablet  2 times daily        08/30/22 1742              Tretha Sciara, MD 08/30/22 2256

## 2022-08-30 NOTE — Discharge Instructions (Addendum)
You were seen today for shortness of breath.  It appears that you have a pneumonia causing COPD exacerbation.  You also had a low potassium which we are starting you on a potassium supplement for.  This is likely secondary to your heart failure and volume overload which are also comorbid least suffering from.  We discussed you being admitted to the hospital however you stated you prefer to be discharged and follow-up with your primary care provider.  It is critical that you return to the emergency department if your symptoms worsen, you have any ongoing shortness of breath. For treatment, we will prescribe amoxicillin and azithromycin for treatment of pneumonia.  We will prescribe albuterol for treatment of your underlying COPD.  We will prescribe potassium supplementation as well.  It is critical you follow-up with your primary care provider within 48 hours for reassessment and recheck of your potassium level as this in particular could be an early indicator of life threatening disease.

## 2022-08-30 NOTE — Assessment & Plan Note (Signed)
this patient has acute respiratory failure with Hypoxia  as documented by the presence of following: O2 saturatio< 90% on RA  Likely due to:  Pneumonia,  , COPD exacerbation,  Provide O2 therapy and titrate as needed  Continuous pulse ox   check Pulse ox with ambulation prior to discharge   may need  TC consult for home O2 set up  flutter valve ordered

## 2022-08-30 NOTE — Assessment & Plan Note (Signed)
Will replace and recheck 

## 2022-08-30 NOTE — Assessment & Plan Note (Signed)
-  -   Will initiate: Steroid taper  -  Antibiotics rocephin azithro - Albuterol  PRN, - scheduled duoneb,  -  Breo or Dulera at discharge   -  Mucinex.  Titrate O2 to saturation >90%. Follow patients respiratory status.  Order nfluenza PCR   VBG no hyperrcapnea

## 2022-08-30 NOTE — Assessment & Plan Note (Signed)
-   Spoke about importance of quitting spent 5 minutes discussing options for treatment, prior attempts at quitting, and dangers of smoking ? -At this point patient is    interested in quitting ? - order nicotine patch  ? - nursing tobacco cessation protocol ? ?

## 2022-08-30 NOTE — ED Notes (Signed)
Lactic acid level 2.2. MD made aware.

## 2022-08-30 NOTE — Assessment & Plan Note (Signed)
- -  Patient presenting with productive cough  Hypoxia  and infiltrate in   lower lobe on chest x-ray -Infiltrate on CXR and 2-3 characteristics (fever, leukocytosis, purulent sputum) are consistent with pneumonia. -This appears to be most likely community-acquired pneumonia.        will admit for treatment of CAP will start on appropriate antibiotic coverage. - Rocephin/azithromycin   Obtain:  sputum cultures,                Obtain respiratory panel                                   COVID PCR negative                   blood cultures and sputum cultures ordered                   strep pneumo UA antigen,                   check for Legionella antigen.                Provide oxygen as needed.

## 2022-08-30 NOTE — ED Notes (Signed)
Patient is being discharged from the Urgent Care and sent to the Emergency Department via personal vehicle . Per Provider Ewell Poe, patient is in need of higher level of care due to shortness of breath. Patient is aware and verbalizes understanding of plan of care.   Vitals:   08/30/22 1042  BP: (!) 182/64  Pulse: 68  Resp: (!) 24  Temp: 98 F (36.7 C)  SpO2: 93%

## 2022-08-30 NOTE — ED Notes (Signed)
Lab will add on magnesium °

## 2022-08-30 NOTE — ED Provider Notes (Signed)
Patient here today for evaluation of shortness of breath, cough that has been ongoing the last 4-5 days. She reports nasal congestion as well. She has been taking multiple OTC meds without significant relief. At rest patients RR is upwards of 24 BPM. She does have history of CHF, pulmonary hypertension and missed ECHO appointment a few months ago for follow up. We do not have xray capability at this location at this time and given her history I strongly recommended evaluation in the ED and discussed concerns for further advancement of illness if not properly evaluated and treated. Patient reports that she will "think about it" and leaves UC frustrated.    Francene Finders, PA-C 08/30/22 1101

## 2022-08-31 ENCOUNTER — Other Ambulatory Visit (HOSPITAL_COMMUNITY): Payer: No Typology Code available for payment source

## 2022-08-31 DIAGNOSIS — J189 Pneumonia, unspecified organism: Secondary | ICD-10-CM | POA: Diagnosis not present

## 2022-08-31 DIAGNOSIS — J441 Chronic obstructive pulmonary disease with (acute) exacerbation: Secondary | ICD-10-CM | POA: Diagnosis not present

## 2022-08-31 DIAGNOSIS — J9601 Acute respiratory failure with hypoxia: Secondary | ICD-10-CM | POA: Diagnosis not present

## 2022-08-31 LAB — PHOSPHORUS: Phosphorus: 3.7 mg/dL (ref 2.5–4.6)

## 2022-08-31 LAB — MRSA NEXT GEN BY PCR, NASAL: MRSA by PCR Next Gen: NOT DETECTED

## 2022-08-31 LAB — RESPIRATORY PANEL BY PCR

## 2022-08-31 LAB — COMPREHENSIVE METABOLIC PANEL
ALT: 10 U/L (ref 0–44)
AST: 10 U/L — ABNORMAL LOW (ref 15–41)
Albumin: 3.2 g/dL — ABNORMAL LOW (ref 3.5–5.0)
Alkaline Phosphatase: 80 U/L (ref 38–126)
Anion gap: 7 (ref 5–15)
BUN: 12 mg/dL (ref 6–20)
CO2: 24 mmol/L (ref 22–32)
Calcium: 8.8 mg/dL — ABNORMAL LOW (ref 8.9–10.3)
Chloride: 110 mmol/L (ref 98–111)
Creatinine, Ser: 0.33 mg/dL — ABNORMAL LOW (ref 0.44–1.00)
GFR, Estimated: >60 mL/min (ref >60)
Glucose, Bld: 191 mg/dL — ABNORMAL HIGH (ref 70–99)
Potassium: 3.7 mmol/L (ref 3.5–5.1)
Sodium: 141 mmol/L (ref 135–145)
Total Bilirubin: 0.3 mg/dL (ref 0.3–1.2)
Total Protein: 6.5 g/dL (ref 6.5–8.1)

## 2022-08-31 LAB — CBC
HCT: 33.9 % — ABNORMAL LOW (ref 36.0–46.0)
Hemoglobin: 11 g/dL — ABNORMAL LOW (ref 12.0–15.0)
MCH: 29.3 pg (ref 26.0–34.0)
MCHC: 32.4 g/dL (ref 30.0–36.0)
MCV: 90.4 fL (ref 80.0–100.0)
Platelets: 255 10*3/uL (ref 150–400)
RBC: 3.75 MIL/uL — ABNORMAL LOW (ref 3.87–5.11)
RDW: 12.7 % (ref 11.5–15.5)
WBC: 7.7 10*3/uL (ref 4.0–10.5)
nRBC: 0 % (ref 0.0–0.2)

## 2022-08-31 LAB — LIPID PANEL
Cholesterol: 103 mg/dL (ref 0–200)
HDL: 29 mg/dL — ABNORMAL LOW (ref >40)
LDL Cholesterol: 63 mg/dL (ref 0–99)
Total CHOL/HDL Ratio: 3.6 RATIO
Triglycerides: 55 mg/dL (ref <150)
VLDL: 11 mg/dL (ref 0–40)

## 2022-08-31 LAB — PREALBUMIN: Prealbumin: 12 mg/dL — ABNORMAL LOW (ref 18–38)

## 2022-08-31 LAB — T4, FREE: Free T4: 3.01 ng/dL — ABNORMAL HIGH (ref 0.61–1.12)

## 2022-08-31 LAB — URINALYSIS, COMPLETE (UACMP) WITH MICROSCOPIC
Bacteria, UA: NONE SEEN
Bilirubin Urine: NEGATIVE
Glucose, UA: >=500 mg/dL — AB
Hgb urine dipstick: NEGATIVE
Ketones, ur: NEGATIVE mg/dL
Leukocytes,Ua: NEGATIVE
Nitrite: NEGATIVE
Protein, ur: NEGATIVE mg/dL
Specific Gravity, Urine: 1.039 — ABNORMAL HIGH (ref 1.005–1.030)
pH: 7 (ref 5.0–8.0)

## 2022-08-31 LAB — EXPECTORATED SPUTUM ASSESSMENT W GRAM STAIN, RFLX TO RESP C

## 2022-08-31 LAB — MAGNESIUM: Magnesium: 2 mg/dL (ref 1.7–2.4)

## 2022-08-31 LAB — PROCALCITONIN: Procalcitonin: 0.5 ng/mL

## 2022-08-31 LAB — HIV ANTIBODY (ROUTINE TESTING W REFLEX): HIV Screen 4th Generation wRfx: NONREACTIVE

## 2022-08-31 LAB — LACTIC ACID, PLASMA: Lactic Acid, Venous: 2.8 mmol/L (ref 0.5–1.9)

## 2022-08-31 LAB — STREP PNEUMONIAE URINARY ANTIGEN: Strep Pneumo Urinary Antigen: NEGATIVE

## 2022-08-31 MED ORDER — AZITHROMYCIN 250 MG PO TABS
500.0000 mg | ORAL_TABLET | Freq: Every day | ORAL | Status: DC
  Administered 2022-08-31: 500 mg via ORAL
  Filled 2022-08-31: qty 2

## 2022-08-31 MED ORDER — PREDNISONE 20 MG PO TABS: 40.0000 mg | ORAL_TABLET | Freq: Every day | ORAL | 0 refills | Status: AC

## 2022-08-31 MED ORDER — IPRATROPIUM-ALBUTEROL 0.5-2.5 (3) MG/3ML IN SOLN: 3.0000 mL | Freq: Three times a day (TID) | RESPIRATORY_TRACT | Status: DC

## 2022-08-31 MED ORDER — AZITHROMYCIN 250 MG PO TABS: ORAL_TABLET | ORAL | 0 refills | Status: DC

## 2022-08-31 MED ORDER — POTASSIUM PHOSPHATES 15 MMOLE/5ML IV SOLN
15.0000 mmol | Freq: Once | INTRAVENOUS | Status: AC
  Administered 2022-08-31: 15 mmol via INTRAVENOUS
  Filled 2022-08-31: qty 5

## 2022-08-31 NOTE — Plan of Care (Signed)
Patient is alert, oriented, and stable. She ambulated to the restroom while I was there and she did it unassisted without issue.

## 2022-08-31 NOTE — Progress Notes (Signed)
  Transition of Care [facility]) Screening Note   Patient Details  Name: [name] Date of Birth: 1961-01-29   [facility] CM/SW Contact:    Vassie Moselle, LCSW Phone Number: 08/31/2022, 12:11 PM    Transition of Care Department [facility]) has reviewed patient and no TOC needs have been identified at this time. We will continue to monitor patient advancement through interdisciplinary progression rounds. If new patient transition needs arise, please place a TOC consult.

## 2022-08-31 NOTE — Discharge Summary (Signed)
Physician Discharge Summary   [name] WJX:[id number] DOB: 1961-05-29 DOA: 08/30/2022  PCP: Dimple Nanas, MD  Admit date: 08/30/2022 Discharge date: 08/31/2022  Barriers to discharge: none  Admitted From: home Disposition:  home Discharging physician: Lewie Chamber, MD  Recommendations for Outpatient Follow-up:  Continue routine outpt care  Home Health:  Equipment/Devices:   Discharge Condition: stable CODE STATUS: Full Diet recommendation:  Diet Orders (From admission, onward)     Start     Ordered   08/31/22 0000  Diet general        08/31/22 1052   08/30/22 2120  Diet Heart Room service appropriate? Yes; Fluid consistency: Thin  Diet effective now       Question Answer Comment  Room service appropriate? Yes   Fluid consistency: Thin      08/30/22 2119            Hospital Course:  [name] is a 61 year old female with PMH diastolic CHF, COPD, ongoing tobacco use, Graves' disease who presented with shortness of breath and cough.  She endorses ongoing tobacco use of approximately 1 PPD. Work-up commenced with CTA chest which was negative for PE.  Small peripheral groundglass opacities appreciated throughout right lung. Procalcitonin was elevated.  She was started on Rocephin and azithromycin along with steroids.  She required oxygen briefly which was able to be weaned to room air prior to discharge. She was transitioned to azithromycin and Omnicef to complete course at discharge.  Prednisone course also provided at time of discharge.  She was also recommended to follow-up with endocrinology regarding ongoing elevated free T4.  She is continued on home dosing of her methimazole.   The patient's chronic medical conditions were treated accordingly per the patient's home medication regimen except as noted.  On day of discharge, patient was felt deemed stable for discharge. Patient/family member advised to call PCP or come back to ER if needed.   Principal  Diagnosis: CAP (community acquired pneumonia)  Discharge Diagnoses: Active Hospital Problems   Diagnosis Date Noted   CAP (community acquired pneumonia) 08/30/2022   Hypophosphatemia 08/31/2022   COPD with acute exacerbation (HCC) 08/30/2022   Acute respiratory failure with hypoxia (HCC) 08/30/2022   Hyperthyroidism 04/13/2017   Tobacco abuse 03/08/2017   Hypokalemia     Resolved Hospital Problems  No resolved problems to display.     Discharge Instructions     Diet general   Complete by: As directed    Increase activity slowly   Complete by: As directed       Allergies as of 08/31/2022       Reactions   Other Rash, Other (See Comments)   Per pt, an unnamed antibiotic used to treat MRSA caused a rash        Medication List     TAKE these medications    albuterol 108 (90 Base) MCG/ACT inhaler Commonly known as: VENTOLIN HFA Inhale 2 puffs into the lungs every 4 (four) hours as needed for wheezing or shortness of breath.   atorvastatin 10 MG tablet Commonly known as: LIPITOR Take 1 tablet (10 mg total) by mouth daily. What changed: when to take this   azithromycin 250 MG tablet Commonly known as: Zithromax Take 1 pill daily for 3 days starting 08/31/22 Start taking on: September 01, 2022   cefdinir 300 MG capsule Commonly known as: OMNICEF Take 1 capsule (300 mg total) by mouth 2 (two) times daily for 5 days.   cetirizine 10 MG  tablet Commonly known as: ZYRTEC Take 10 mg by mouth daily.   Excedrin Migraine 250-250-65 MG tablet Generic drug: aspirin-acetaminophen-caffeine Take 1 tablet by mouth every 6 (six) hours as needed for headache.   guaiFENesin 600 MG 12 hr tablet Commonly known as: MUCINEX Take 600 mg by mouth 2 (two) times daily as needed for to loosen phlegm or cough.   methimazole 5 MG tablet Commonly known as: TAPAZOLE Take 1.5 tablets (7.5 mg total) by mouth daily. What changed: when to take this   omeprazole 20 MG tablet Commonly known  as: PRILOSEC OTC Take 20 mg by mouth See admin instructions. Take 20 mg by mouth every morning before breakfast and an additional 20 mg at bedtime as needed for unresolved reflux   predniSONE 20 MG tablet Commonly known as: DELTASONE Take 2 tablets (40 mg total) by mouth daily with breakfast for 5 days. Start taking on: September 01, 2022   Vicks DayQuil Severe Cold/Flu 5-10-200-325 MG Caps Generic drug: Phenylephrine-DM-GG-APAP Take 1 capsule by mouth every 6 (six) hours as needed (for congestion/cold-like symptoms).        Follow-up Information     Schedule an appointment as soon as possible for a visit  with Connect with your PCP/Specialist as discussed.   Contact information: [email or web address] Call our physician referral line at [phone].               Allergies  Allergen Reactions   Other Rash and Other (See Comments)    Per pt, an unnamed antibiotic used to treat MRSA caused a rash    Consultations:   Procedures:   Discharge Exam: BP (!) 143/60   Pulse 80   Temp 98.2 F (36.8 C) (Oral)   Resp 17   Ht 5' 4.02" (1.626 m)   Wt 77.6 kg   SpO2 93%   BMI 29.35 kg/m  Physical Exam Constitutional:      Appearance: Normal appearance.  HENT:     Head: Normocephalic and atraumatic.     Mouth/Throat:     Mouth: Mucous membranes are moist.  Eyes:     Extraocular Movements: Extraocular movements intact.  Cardiovascular:     Rate and Rhythm: Normal rate and regular rhythm.  Pulmonary:     Effort: Pulmonary effort is normal. No respiratory distress.     Breath sounds: No wheezing.  Abdominal:     General: Bowel sounds are normal. There is no distension.     Palpations: Abdomen is soft.  Musculoskeletal:        General: Normal range of motion.     Cervical back: Normal range of motion.  Skin:    General: Skin is warm.  Neurological:     General: No focal deficit present.     Mental Status: She is alert.  Psychiatric:         Mood and Affect: Mood normal.      The results of significant diagnostics from this hospitalization (including imaging, microbiology, ancillary and laboratory) are listed below for reference.   Microbiology: Recent Results (from the past 240 hour(s))  Resp Panel by RT-PCR (Flu A&B, Covid) Anterior Nasal Swab     Status: None   Collection Time: 08/30/22  2:31 PM   Specimen: Anterior Nasal Swab  Result Value Ref Range Status   SARS Coronavirus 2 by RT PCR NEGATIVE NEGATIVE Final    Comment: (NOTE) SARS-CoV-2 target nucleic acids are NOT DETECTED.  The SARS-CoV-2 RNA is generally detectable in upper respiratory specimens during  the acute phase of infection. The lowest concentration of SARS-CoV-2 viral copies this assay can detect is 138 copies/mL. A negative result does not preclude SARS-Cov-2 infection and should not be used as the sole basis for treatment or other patient management decisions. A negative result may occur with  improper specimen collection/handling, submission of specimen other than nasopharyngeal swab, presence of viral mutation(s) within the areas targeted by this assay, and inadequate number of viral copies(<138 copies/mL). A negative result must be combined with clinical observations, patient history, and epidemiological information. The expected result is Negative.  Fact Sheet for Patients:  EntrepreneurPulse.com.au  Fact Sheet for Healthcare Providers:  IncredibleEmployment.be  This test is no t yet approved or cleared by the [place] FDA and  has been authorized for detection and/or diagnosis of SARS-CoV-2 by FDA under an Emergency Use Authorization (EUA). This EUA will remain  in effect (meaning this test can be used) for the duration of the COVID-19 declaration under Section 564(b)(1) of the Act, 21 U.S.C.section 360bbb-3(b)(1), unless the authorization is terminated  or revoked sooner.       Influenza A by  PCR NEGATIVE NEGATIVE Final   Influenza B by PCR NEGATIVE NEGATIVE Final    Comment: (NOTE) The Xpert Xpress SARS-CoV-2/FLU/RSV plus assay is intended as an aid in the diagnosis of influenza from Nasopharyngeal swab specimens and should not be used as a sole basis for treatment. Nasal washings and aspirates are unacceptable for Xpert Xpress SARS-CoV-2/FLU/RSV testing.  Fact Sheet for Patients: EntrepreneurPulse.com.au  Fact Sheet for Healthcare Providers: IncredibleEmployment.be  This test is not yet approved or cleared by the [place] FDA and has been authorized for detection and/or diagnosis of SARS-CoV-2 by FDA under an Emergency Use Authorization (EUA). This EUA will remain in effect (meaning this test can be used) for the duration of the COVID-19 declaration under Section 564(b)(1) of the Act, 21 U.S.C. section 360bbb-3(b)(1), unless the authorization is terminated or revoked.  Performed at [facility], [place] [address] [place], [place] [address]   Culture, blood (routine x 2) Call MD if unable to obtain prior to antibiotics being given     Status: None (Preliminary result)   Collection Time: 08/30/22  9:47 PM   Specimen: BLOOD LEFT HAND  Result Value Ref Range Status   Specimen Description   Final    BLOOD LEFT HAND Performed at [facility], [place] [address], [place], [place] [address]    Special Requests   Final    BOTTLES DRAWN AEROBIC ONLY Blood Culture adequate volume Performed at [place] [address], [place], [place] [address]    Culture PENDING  Incomplete   Report Status PENDING  Incomplete  MRSA Next Gen by PCR, Nasal     Status: None   Collection Time: 08/30/22 11:28 PM   Specimen: Nasal Mucosa; Nasal Swab  Result Value Ref Range Status   MRSA by PCR Next Gen NOT DETECTED NOT DETECTED Final    Comment: (NOTE) The GeneXpert MRSA Assay (FDA approved for NASAL  specimens only), is one component of a comprehensive MRSA colonization surveillance program. It is not intended to diagnose MRSA infection nor to guide or monitor treatment for MRSA infections. Test performance is not FDA approved in patients less than 34 years old. Performed at [facility], [place] [address] [place], [place] [address]   Respiratory (~20 pathogens) panel by PCR     Status: None   Collection Time: 08/30/22  11:38 PM   Specimen: Nasopharyngeal Swab; Respiratory  Result Value Ref Range Status   Adenovirus NOT DETECTED NOT DETECTED Final   Coronavirus 229E NOT DETECTED NOT DETECTED Final    Comment: (NOTE) The Coronavirus on the Respiratory Panel, DOES NOT test for the novel  Coronavirus (2019 nCoV)    Coronavirus HKU1 NOT DETECTED NOT DETECTED Final   Coronavirus NL63 NOT DETECTED NOT DETECTED Final   Coronavirus OC43 NOT DETECTED NOT DETECTED Final   Metapneumovirus NOT DETECTED NOT DETECTED Final   Rhinovirus / Enterovirus NOT DETECTED NOT DETECTED Final   Influenza A NOT DETECTED NOT DETECTED Final   Influenza B NOT DETECTED NOT DETECTED Final   Parainfluenza Virus 1 NOT DETECTED NOT DETECTED Final   Parainfluenza Virus 2 NOT DETECTED NOT DETECTED Final   Parainfluenza Virus 3 NOT DETECTED NOT DETECTED Final   Parainfluenza Virus 4 NOT DETECTED NOT DETECTED Final   Respiratory Syncytial Virus NOT DETECTED NOT DETECTED Final   Bordetella pertussis NOT DETECTED NOT DETECTED Final   Bordetella Parapertussis NOT DETECTED NOT DETECTED Final   Chlamydophila pneumoniae NOT DETECTED NOT DETECTED Final   Mycoplasma pneumoniae NOT DETECTED NOT DETECTED Final    Comment: Performed at [facility], [address], [place], [place] [address]  Expectorated Sputum Assessment w Gram Stain, Rflx to Resp Cult     Status: None   Collection Time: 08/30/22 11:54 PM   Specimen: Expectorated Sputum  Result Value Ref Range Status   Specimen Description  EXPECTORATED SPUTUM  Final   Special Requests NONE  Final   Sputum evaluation   Final    THIS SPECIMEN IS ACCEPTABLE FOR SPUTUM CULTURE Performed at [facility], [address], [place], [place] [address]    Report Status 08/31/2022 FINAL  Final     Labs: BNP (last 3 results) Recent Labs    08/30/22 1431  BNP 125.5*   Basic Metabolic Panel: Recent Labs  Lab 08/30/22 1431 08/30/22 1800 08/30/22 2145 08/31/22 0511  NA 140  --  140 141  K 2.4*  --  2.5* 3.7  CL 103  --  107 110  CO2 29  --  25 24  GLUCOSE 132*  --  292* 191*  BUN 12  --  12 12  CREATININE 0.40*  --  0.56 0.33*  CALCIUM 8.9  --  8.6* 8.8*  MG  --  1.9  --  2.0  PHOS  --   --  1.3* 3.7   Liver Function Tests: Recent Labs  Lab 08/30/22 1431 08/30/22 2145 08/31/22 0511  AST 11* 14* 10*  ALT ALKPHOS 89 82 80  BILITOT 0.7 0.2* 0.3  PROT 6.9 6.6 6.5  ALBUMIN 3.4* 3.3* 3.2*   No results for input(s): "LIPASE", "AMYLASE" in the last 168 hours. No results for input(s): "AMMONIA" in the last 168 hours. CBC: Recent Labs  Lab 08/30/22 1431 08/30/22 2145 08/31/22 0511  WBC 17.1* 9.9 7.7  NEUTROABS 12.8* 8.9*  --   HGB 11.7* 11.1* 11.0*  HCT 35.3* 34.3* 33.9*  MCV 89.1 90.5 90.4  PLT 272 280 255   Cardiac Enzymes: Recent Labs  Lab 08/30/22 2145  CKTOTAL 28*   BNP: Invalid input(s): "POCBNP" CBG: No results for input(s): "GLUCAP" in the last 168 hours. D-Dimer No results for input(s): "DDIMER" in the last 72 hours. Hgb A1c No results for input(s): "HGBA1C" in the last 72 hours. Lipid Profile Recent Labs  08/31/22 0043  CHOL 103  HDL 29*  LDLCALC 63  TRIG 55  CHOLHDL 3.6   Thyroid function studies Recent Labs    08/30/22 2145  TSH <0.010*   Anemia work up No results for input(s): "VITAMINB12", "FOLATE", "FERRITIN", "TIBC", "IRON", "RETICCTPCT" in the last 72 hours. Urinalysis    Component Value Date/Time   COLORURINE STRAW (A) 08/30/2022 2353    APPEARANCEUR CLEAR 08/30/2022 2353   LABSPEC 1.039 (H) 08/30/2022 2353   PHURINE 7.0 08/30/2022 2353   GLUCOSEU >=500 (A) 08/30/2022 2353   HGBUR NEGATIVE 08/30/2022 2353   BILIRUBINUR NEGATIVE 08/30/2022 2353   KETONESUR NEGATIVE 08/30/2022 2353   PROTEINUR NEGATIVE 08/30/2022 2353   UROBILINOGEN 0.2 10/31/2008 2148   NITRITE NEGATIVE 08/30/2022 2353   LEUKOCYTESUR NEGATIVE 08/30/2022 2353   Sepsis Labs Recent Labs  Lab 08/30/22 1431 08/30/22 2145 08/31/22 0511  WBC 17.1* 9.9 7.7   Microbiology Recent Results (from the past 240 hour(s))  Resp Panel by RT-PCR (Flu A&B, Covid) Anterior Nasal Swab     Status: None   Collection Time: 08/30/22  2:31 PM   Specimen: Anterior Nasal Swab  Result Value Ref Range Status   SARS Coronavirus 2 by RT PCR NEGATIVE NEGATIVE Final    Comment: (NOTE) SARS-CoV-2 target nucleic acids are NOT DETECTED.  The SARS-CoV-2 RNA is generally detectable in upper respiratory specimens during the acute phase of infection. The lowest concentration of SARS-CoV-2 viral copies this assay can detect is 138 copies/mL. A negative result does not preclude SARS-Cov-2 infection and should not be used as the sole basis for treatment or other patient management decisions. A negative result may occur with  improper specimen collection/handling, submission of specimen other than nasopharyngeal swab, presence of viral mutation(s) within the areas targeted by this assay, and inadequate number of viral copies(<138 copies/mL). A negative result must be combined with clinical observations, patient history, and epidemiological information. The expected result is Negative.  Fact Sheet for Patients:  BloggerCourse.com  Fact Sheet for Healthcare Providers:  SeriousBroker.it  This test is no t yet approved or cleared by the [place] FDA and  has been authorized for detection and/or diagnosis of SARS-CoV-2 by FDA  under an Emergency Use Authorization (EUA). This EUA will remain  in effect (meaning this test can be used) for the duration of the COVID-19 declaration under Section 564(b)(1) of the Act, 21 U.S.C.section 360bbb-3(b)(1), unless the authorization is terminated  or revoked sooner.       Influenza A by PCR NEGATIVE NEGATIVE Final   Influenza B by PCR NEGATIVE NEGATIVE Final    Comment: (NOTE) The Xpert Xpress SARS-CoV-2/FLU/RSV plus assay is intended as an aid in the diagnosis of influenza from Nasopharyngeal swab specimens and should not be used as a sole basis for treatment. Nasal washings and aspirates are unacceptable for Xpert Xpress SARS-CoV-2/FLU/RSV testing.  Fact Sheet for Patients: BloggerCourse.com  Fact Sheet for Healthcare Providers: SeriousBroker.it  This test is not yet approved or cleared by the [place] FDA and has been authorized for detection and/or diagnosis of SARS-CoV-2 by FDA under an Emergency Use Authorization (EUA). This EUA will remain in effect (meaning this test can be used) for the duration of the COVID-19 declaration under Section 564(b)(1) of the Act, 21 U.S.C. section 360bbb-3(b)(1), unless the authorization is terminated or revoked.  Performed at [facility], [address], [place], [place] [address]   Culture, blood (routine x 2) Call MD if unable to obtain prior to  antibiotics being given     Status: None (Preliminary result)   Collection Time: 08/30/22  9:47 PM   Specimen: BLOOD LEFT HAND  Result Value Ref Range Status   Specimen Description   Final    BLOOD LEFT HAND Performed at [facility], [address], [place], [place] [address]    Special Requests   Final    BOTTLES DRAWN AEROBIC ONLY Blood Culture adequate volume Performed at [facility], [address], [place], [place] [address]    Culture PENDING  Incomplete   Report Status  PENDING  Incomplete  MRSA Next Gen by PCR, Nasal     Status: None   Collection Time: 08/30/22 11:28 PM   Specimen: Nasal Mucosa; Nasal Swab  Result Value Ref Range Status   MRSA by PCR Next Gen NOT DETECTED NOT DETECTED Final    Comment: (NOTE) The GeneXpert MRSA Assay (FDA approved for NASAL specimens only), is one component of a comprehensive MRSA colonization surveillance program. It is not intended to diagnose MRSA infection nor to guide or monitor treatment for MRSA infections. Test performance is not FDA approved in patients less than 11 years old. Performed at [facility], [address] [place], [place] [address]   Respiratory (~20 pathogens) panel by PCR     Status: None   Collection Time: 08/30/22 11:38 PM   Specimen: Nasopharyngeal Swab; Respiratory  Result Value Ref Range Status   Adenovirus NOT DETECTED NOT DETECTED Final   Coronavirus 229E NOT DETECTED NOT DETECTED Final    Comment: (NOTE) The Coronavirus on the Respiratory Panel, DOES NOT test for the novel  Coronavirus (2019 nCoV)    Coronavirus HKU1 NOT DETECTED NOT DETECTED Final   Coronavirus NL63 NOT DETECTED NOT DETECTED Final   Coronavirus OC43 NOT DETECTED NOT DETECTED Final   Metapneumovirus NOT DETECTED NOT DETECTED Final   Rhinovirus / Enterovirus NOT DETECTED NOT DETECTED Final   Influenza A NOT DETECTED NOT DETECTED Final   Influenza B NOT DETECTED NOT DETECTED Final   Parainfluenza Virus 1 NOT DETECTED NOT DETECTED Final   Parainfluenza Virus 2 NOT DETECTED NOT DETECTED Final   Parainfluenza Virus 3 NOT DETECTED NOT DETECTED Final   Parainfluenza Virus 4 NOT DETECTED NOT DETECTED Final   Respiratory Syncytial Virus NOT DETECTED NOT DETECTED Final   Bordetella pertussis NOT DETECTED NOT DETECTED Final   Bordetella Parapertussis NOT DETECTED NOT DETECTED Final   Chlamydophila pneumoniae NOT DETECTED NOT DETECTED Final   Mycoplasma pneumoniae NOT DETECTED NOT DETECTED Final     Comment: Performed at [facility], [address], [place], [place] [address]  Expectorated Sputum Assessment w Gram Stain, Rflx to Resp Cult     Status: None   Collection Time: 08/30/22 11:54 PM   Specimen: Expectorated Sputum  Result Value Ref Range Status   Specimen Description EXPECTORATED SPUTUM  Final   Special Requests NONE  Final   Sputum evaluation   Final    THIS SPECIMEN IS ACCEPTABLE FOR SPUTUM CULTURE Performed at [facility], [address] [place], [place] [address]    Report Status 08/31/2022 FINAL  Final    Procedures/Studies: CT Angio Chest Pulmonary Embolism (PE) W or WO Contrast  Result Date: 08/30/2022 CLINICAL DATA:  PE suspected. Shortness of breath for the past few days and cough. EXAM: CT ANGIOGRAPHY CHEST WITH CONTRAST TECHNIQUE: Multidetector CT imaging of the chest was performed using the standard protocol during bolus administration  of intravenous contrast. Multiplanar CT image reconstructions and MIPs were obtained to evaluate the vascular anatomy. RADIATION DOSE REDUCTION: This exam was performed according to the departmental dose-optimization program which includes automated exposure control, adjustment of the mA and/or kV according to patient size and/or use of iterative reconstruction technique. CONTRAST:  4mL OMNIPAQUE IOHEXOL 350 MG/ML SOLN COMPARISON:  CT chest 08/27/2021 FINDINGS: Cardiovascular: Satisfactory opacification of the pulmonary arteries to the segmental level. No evidence of pulmonary embolism. Normal heart size. No pericardial effusion. Scattered aortic calcific atherosclerosis. Mediastinum/Nodes: An enlarged right hilar lymph node measures 1 cm short axis (series 5, image 102). Prominent pretracheal lymph node is unchanged compared to prior CT 08/27/2021 (series 5, image 84). No enlarged axillary lymph nodes. The trachea and esophagus demonstrate no significant findings. Stable enlarged thyroid. Lungs/Pleura: Upper lobe  predominant paraseptal and centrilobular emphysema with apical scarring, similar to prior exams. Small patches of peripheral ground-glass opacities noted in the throughout the right lung. Subsegmental atelectasis in the lingula. Left lung is otherwise clear. No pleural effusion or pneumothorax. Upper Abdomen: No acute abnormality. Musculoskeletal: No chest wall abnormality. No acute or suspicious osseous findings. Review of the MIP images confirms the above findings. IMPRESSION: 1. No pulmonary embolism. 2. Small patches of peripheral ground-glass opacities are noted throughout the right lung, favored to represent infection including viral etiologies such as COVID-19. 3. Chronic upper lobe predominant emphysema with apical scarring. 4.  Aortic Atherosclerosis (ICD10-I70.0). 5. Stable thyromegaly. Electronically Signed   By: Sherron Ales M.D.   On: 08/30/2022 20:10   DG Chest 2 View  Result Date: 08/30/2022 CLINICAL DATA:  Productive cough.  Short of breath for several days. EXAM: CHEST - 2 VIEW COMPARISON:  05/13/2021.  CT, 08/27/2021. FINDINGS: Cardiac silhouette is normal in size and configuration. No mediastinal or hilar masses. No evidence of adenopathy. Streaky opacity noted at the anterior right lung base, increased compared to the prior chest radiographs. Mild stable scarring at the apices. Remainder of the lungs is clear. No pleural effusion or pneumothorax. Skeletal structures are intact. IMPRESSION: 1. Opacity at the base of the right middle lobe consistent with bronchopneumonia. 2. No other evidence of acute cardiopulmonary disease. Electronically Signed   By: Amie Portland M.D.   On: 08/30/2022 14:12     Time coordinating discharge: Over 30 minutes    Lewie Chamber, MD  [facility] 08/31/2022, 2:20 PM

## 2022-08-31 NOTE — Plan of Care (Signed)
AVS discussed and patient verbalized understanding. IV and telemetry removed

## 2022-08-31 NOTE — Evaluation (Signed)
Physical Therapy Evaluation Patient Details Name: [name] MRN: [id number] DOB: 03-Jan-1961 Today's Date: 08/31/2022  History of Present Illness  a 61 y.o. female with medical history significant of diastolic CHF, COPD, history of tobacco abuse, Graves' disease.  Presented with   shortness of breath  Has been having nasal congestion and shortness of breath for the past 5 days. Dx of PNA.  Clinical Impression  Pt is mobilizing well at an independent level, she ambulated 150' without an assistive device, no loss of balance, SpO2 0-93% on room air while walking, 3/4 dyspnea. She does not need further acute PT. She was encouraged to ambulate TID in halls to minimize deconditioning. PT signing off.         Recommendations for follow up therapy are one component of a multi-disciplinary discharge planning process, led by the attending physician.  Recommendations may be updated based on patient status, additional functional criteria and insurance authorization.  Follow Up Recommendations No PT follow up      Assistance Recommended at Discharge None  Patient can return home with the following       Equipment Recommendations None recommended by PT  Recommendations for Other Services       Functional Status Assessment Patient has not had a recent decline in their functional status     Precautions / Restrictions Precautions Precautions: None Precaution Comments: monitor O2; denies falls in past 6 months Restrictions Weight Bearing Restrictions: No      Mobility  Bed Mobility Overal bed mobility: Independent                  Transfers Overall transfer level: Independent                      Ambulation/Gait Ambulation/Gait assistance: Independent Gait Distance (Feet): 150 Feet Assistive device: None Gait Pattern/deviations: WFL(Within Functional Limits) Gait velocity: WNL     General Gait Details: steady, no loss of balance, 3/4 dyspnea, SaO2 90-93% on room  air while walking  Stairs            Wheelchair Mobility    Modified Rankin (Stroke Patients Only)       Balance Overall balance assessment: Independent                                           Pertinent Vitals/Pain Pain Assessment Pain Assessment: Faces Faces Pain Scale: Hurts even more Pain Location: headache Pain Descriptors / Indicators: Aching Pain Intervention(s): Limited activity within patient's tolerance, Monitored during session, Premedicated before session    Home Living Family/patient expects to be discharged to:: Private residence Living Arrangements: Other relatives Available Help at Discharge: Family;Available 24 hours/day Type of Home: House Home Access: Stairs to enter   Secretary/administrator of Steps: 3     Home Equipment: None Additional Comments: lives with brother and 73 year old grandson    Prior Function Prior Level of Function : Independent/Modified Independent             Mobility Comments: walks without AD, no falls in past 6 months, cares for 7 y.o. grandson ADLs Comments: independent     Hand Dominance        Extremity/Trunk Assessment   Upper Extremity Assessment Upper Extremity Assessment: Overall WFL for tasks assessed    Lower Extremity Assessment Lower Extremity Assessment: Overall WFL for tasks assessed  Cervical / Trunk Assessment Cervical / Trunk Assessment: Normal  Communication      Cognition Arousal/Alertness: Awake/alert Behavior During Therapy: WFL for tasks assessed/performed Overall Cognitive Status: Within Functional Limits for tasks assessed                                          General Comments      Exercises     Assessment/Plan    PT Assessment Patient does not need any further PT services  PT Problem List         PT Treatment Interventions      PT Goals (Current goals can be found in the Care Plan section)  Acute Rehab PT Goals Patient  Stated Goal: care for 81 y.o. grandson PT Goal Formulation: All assessment and education complete, DC therapy    Frequency       Co-evaluation               AM-PAC PT "6 Clicks" Mobility  Outcome Measure Help needed turning from your back to your side while in a flat bed without using bedrails?: None Help needed moving from lying on your back to sitting on the side of a flat bed without using bedrails?: None Help needed moving to and from a bed to a chair (including a wheelchair)?: None Help needed standing up from a chair using your arms (e.g., wheelchair or bedside chair)?: None Help needed to walk in hospital room?: None Help needed climbing 3-5 steps with a railing? : None 6 Click Score: 24    End of Session   Activity Tolerance: Patient limited by fatigue;Patient tolerated treatment well Patient left: Other (comment);with call bell/phone within reach (in bathroom) Nurse Communication: Mobility status      Time: 2585-2778 PT Time Calculation (min) (ACUTE ONLY): 12 min   Charges:   PT Evaluation $PT Eval Low Complexity: 1 Low          Blondell Reveal Kistler PT 08/31/2022  Acute Rehabilitation Services  Office [phone]

## 2022-08-31 NOTE — Assessment & Plan Note (Signed)
Will replace ?

## 2022-09-01 LAB — LEGIONELLA PNEUMOPHILA SEROGP 1 UR AG: L. pneumophila Serogp 1 Ur Ag: NEGATIVE

## 2022-09-01 LAB — T3: T3, Total: 188 ng/dL — ABNORMAL HIGH (ref 71–180)

## 2022-09-02 LAB — CULTURE, RESPIRATORY W GRAM STAIN: Culture: NORMAL

## 2022-09-05 LAB — CULTURE, BLOOD (ROUTINE X 2)
Culture: NO GROWTH
Culture: NO GROWTH
Special Requests: ADEQUATE
Special Requests: ADEQUATE

## 2022-09-11 ENCOUNTER — Other Ambulatory Visit: Payer: Self-pay | Admitting: Cardiology

## 2022-09-18 ENCOUNTER — Encounter: Payer: Self-pay | Admitting: Internal Medicine

## 2022-09-18 ENCOUNTER — Other Ambulatory Visit: Payer: Self-pay

## 2022-09-18 ENCOUNTER — Ambulatory Visit: Payer: No Typology Code available for payment source | Admitting: Internal Medicine

## 2022-09-18 VITALS — BP 134/78 | HR 78 | Ht 64.02 in | Wt 163.0 lb

## 2022-09-18 DIAGNOSIS — E0590 Thyrotoxicosis, unspecified without thyrotoxic crisis or storm: Secondary | ICD-10-CM

## 2022-09-18 DIAGNOSIS — E010 Iodine-deficiency related diffuse (endemic) goiter: Secondary | ICD-10-CM

## 2022-09-18 MED ORDER — METHIMAZOLE 5 MG PO TABS: 10.0000 mg | ORAL_TABLET | Freq: Every day | ORAL | 3 refills | Status: DC

## 2022-09-18 MED ORDER — METHIMAZOLE 5 MG PO TABS: 7.5000 mg | ORAL_TABLET | Freq: Every day | ORAL | 1 refills | Status: DC

## 2022-09-18 NOTE — Patient Instructions (Signed)
Increase methimazole 5 mg, 2 tablets daily

## 2022-09-18 NOTE — Progress Notes (Signed)
Name: [name]  MRN/ DOB: [id number], 08/07/1961    Age/ Sex: 61 y.o., female     PCP: Damita Lack, MD   Reason for Endocrinology Evaluation: Hyperthyroidism     Initial Endocrinology Clinic Visit: 05/25/2017    PATIENT IDENTIFIER: [name] is a 61 y.o., female with a past medical history of CHF, Hyperthyroidism, COPD, . She has followed with [facility] since 05/25/2017 for consultative assistance with management of her hyperthyroidism.   HISTORICAL SUMMARY: The patient was first diagnosed with hyperthyroidism  in 2018, this has been attributed to Graves' disease.  She has been on methimazole since her diagnosis   Cousin with thyroid disease Berenice Primas' Disease)   She was follows by Dr. Loanne Drilling from    until 02/2022  SUBJECTIVE:    Today (09/18/2022):  [name] is here for a follow upon Hyperthyroidism.   Patient had an ED visit on 08/30/2022 for COPD exacerbation, labs indicated hyperthyroidism  Has occasional palpitations  Occasional tremors  Denies diarrhea or abdominal pain  Denies fever  She has noted local neck swelling   She has custody of her 7 yr grandson   Methimazole 5 mg, 1.5 tablets  daily   HISTORY:  Past Medical History:  Past Medical History:  Diagnosis Date   CHF (congestive heart failure) ([place])    Graves disease    Herniated disc    Past Surgical History:  Past Surgical History:  Procedure Laterality Date   back L4-5 herniation repair     BUNIONECTOMY     CARPAL TUNNEL RELEASE Right    CATARACT EXTRACTION, BILATERAL     plate and screws of R foot/arm     Social History:  reports that she has been smoking cigarettes. She started smoking about 44 years ago. She has a 43.00 pack-year smoking history. She has never used smokeless tobacco. She reports that she does not drink alcohol and does not use drugs. Family History:  Family History  Problem Relation Age of Onset   Thyroid disease Neg Hx      HOME  MEDICATIONS: Allergies as of 09/18/2022       Reactions   Other Rash, Other (See Comments)   Per pt, an unnamed antibiotic used to treat MRSA caused a rash        Medication List        Accurate as of September 18, 2022  3:57 PM. If you have any questions, ask your nurse or doctor.          STOP taking these medications    azithromycin 250 MG tablet Commonly known as: Zithromax Stopped by: Dorita Sciara, MD       TAKE these medications    albuterol 108 (90 Base) MCG/ACT inhaler Commonly known as: VENTOLIN HFA Inhale 2 puffs into the lungs every 4 (four) hours as needed for wheezing or shortness of breath.   atorvastatin 10 MG tablet Commonly known as: LIPITOR Take 1 tablet (10 mg total) by mouth daily.   cetirizine 10 MG tablet Commonly known as: ZYRTEC Take 10 mg by mouth daily.   Excedrin Migraine 250-250-65 MG tablet Generic drug: aspirin-acetaminophen-caffeine Take 1 tablet by mouth every 6 (six) hours as needed for headache.   guaiFENesin 600 MG 12 hr tablet Commonly known as: MUCINEX Take 600 mg by mouth 2 (two) times daily as needed for to loosen phlegm or cough.   methimazole 5 MG tablet Commonly known as: TAPAZOLE Take 2  tablets (10 mg total) by mouth daily. What changed: how much to take Changed by: Scarlette Shorts, MD   omeprazole 20 MG tablet Commonly known as: PRILOSEC OTC Take 20 mg by mouth See admin instructions. Take 20 mg by mouth every morning before breakfast and an additional 20 mg at bedtime as needed for unresolved reflux   Vicks DayQuil Severe Cold/Flu 5-10-200-325 MG Caps Generic drug: Phenylephrine-DM-GG-APAP Take 1 capsule by mouth every 6 (six) hours as needed (for congestion/cold-like symptoms).          OBJECTIVE:   PHYSICAL EXAM: VS: BP 134/78 (BP Location: Left Arm, Patient Position: Sitting, Cuff Size: Small)   Pulse 78   Ht 5' 4.02" (1.626 m)   Wt 163 lb (73.9 kg)   SpO2 98%   BMI 27.96 kg/m     EXAM: General: Pt appears well and is in NAD  Eyes: External eye exam normal without stare, lid lag or exophthalmos.  EOM intact.    Neck: General: Supple without adenopathy. Thyroid: Thyroid size normal.  Right thyroid nodule palpated   Lungs: Clear with good BS bilat with no rales, rhonchi, or wheezes  Heart: Auscultation: RRR.  Abdomen: Normoactive bowel sounds, soft, nontender, without masses or organomegaly palpable  Extremities:  BL LE: No pretibial edema normal ROM and strength.  Mental Status: Judgment, insight: Intact Orientation: Oriented to time, place, and person Mood and affect: No depression, anxiety, or agitation     DATA REVIEWED:    Latest Reference Range & Units Most Recent 08/31/22 05:11  TSH 0.350 - 4.500 uIU/mL <0.010 (L) 08/30/22 21:45   Triiodothyronine (T3) 71 - 180 ng/dL 818 (H) 29/9/37 16:96 789 (H)  T4,Free(Direct) 0.61 - 1.12 ng/dL 3.81 (H) 12/06/49 02:58 3.01 (H)     Latest Reference Range & Units 08/31/22 05:11  Sodium 135 - 145 mmol/L 141  Potassium 3.5 - 5.1 mmol/L 3.7  Chloride 98 - 111 mmol/L 110  CO2 22 - 32 mmol/L 24  Glucose 70 - 99 mg/dL 527 (H)  BUN 6 - 20 mg/dL 12  Creatinine 7.82 - 4.23 mg/dL 5.36 (L)  Calcium 8.9 - 10.3 mg/dL 8.8 (L)  Anion gap 5 - 15  7  Phosphorus 2.5 - 4.6 mg/dL 3.7  Magnesium 1.7 - 2.4 mg/dL 2.0  Alkaline Phosphatase 38 - 126 U/L 80  Albumin 3.5 - 5.0 g/dL 3.2 (L)  AST 15 - 41 U/L 10 (L)  ALT 0 - 44 U/L 10  Total Protein 6.5 - 8.1 g/dL 6.5  Total Bilirubin 0.3 - 1.2 mg/dL 0.3  GFR, Estimated >14 mL/min >60    Latest Reference Range & Units 08/31/22 05:11  WBC 4.0 - 10.5 K/uL 7.7  RBC 3.87 - 5.11 MIL/uL 3.75 (L)  Hemoglobin 12.0 - 15.0 g/dL 43.1 (L)  HCT 54.0 - 08.6 % 33.9 (L)  MCV 80.0 - 100.0 fL 90.4  MCH 26.0 - 34.0 pg 29.3  MCHC 30.0 - 36.0 g/dL 76.1  RDW 95.0 - 93.2 % 12.7  Platelets 150 - 400 K/uL 255  nRBC 0.0 - 0.2 % 0.0   Old records , labs and images have been reviewed.   ASSESSMENT  / PLAN / RECOMMENDATIONS:   Hyperthyroidism :  -Patient is biochemically hyperthyroid  -She assures me compliance -This has been attributed to Graves' disease based on clinical suspicion, no TRAb in the past -We discussed increasing methimazole as below -Patient is not interested in radioactive iodine ablation or surgical intervention at this time, she has  custody of her 10-year-old grandson and he is a high needs child and is unable to quarantine from him   Medications   Increase methimazole 5 mg 2 tabs daily  2. Thyromegaly    - Right neck nodules noted on exam today  -We will proceed with thyroid ultrasound, this could be the main reason for hyperthyroid    Follow-up in 4 months Labs in 4 weeks   Signed electronically by: Lyndle Herrlich, MD  [facility] End[facility]in[facility]  [facility] [address] [place] [place], [place] [address] Phone: [phone] FAX: [phone]      CC: Dimple Nanas, MD [address] [place] [address] Phone: [phone]  Fax: [phone]   Return to Endocrinology clinic as below: Future Appointments  Date Time Provider Department Center  10/20/2022  8:30 AM [facility] None  01/27/2023  1:20 PM Willia Genrich, Konrad Dolores, MD [facility] None

## 2022-09-25 ENCOUNTER — Other Ambulatory Visit: Payer: Self-pay

## 2022-10-20 ENCOUNTER — Other Ambulatory Visit (INDEPENDENT_AMBULATORY_CARE_PROVIDER_SITE_OTHER): Payer: No Typology Code available for payment source

## 2022-10-20 DIAGNOSIS — E0590 Thyrotoxicosis, unspecified without thyrotoxic crisis or storm: Secondary | ICD-10-CM | POA: Diagnosis not present

## 2022-10-20 LAB — TSH: TSH: <0.01 u[IU]/mL — ABNORMAL LOW (ref 0.35–5.50)

## 2022-10-20 LAB — T4, FREE: Free T4: 1.11 ng/dL (ref 0.60–1.60)

## 2022-10-21 LAB — T3: T3, Total: 192 ng/dL — ABNORMAL HIGH (ref 76–181)

## 2023-01-27 ENCOUNTER — Encounter: Payer: Self-pay | Admitting: Internal Medicine

## 2023-01-27 ENCOUNTER — Ambulatory Visit: Payer: No Typology Code available for payment source | Admitting: Internal Medicine

## 2023-01-27 VITALS — BP 126/80 | HR 110 | Ht 64.02 in | Wt 163.0 lb

## 2023-01-27 DIAGNOSIS — E0590 Thyrotoxicosis, unspecified without thyrotoxic crisis or storm: Secondary | ICD-10-CM

## 2023-01-27 DIAGNOSIS — E010 Iodine-deficiency related diffuse (endemic) goiter: Secondary | ICD-10-CM | POA: Diagnosis not present

## 2023-01-27 LAB — T4, FREE: Free T4: 1.01 ng/dL (ref 0.60–1.60)

## 2023-01-27 LAB — TSH: TSH: 0 u[IU]/mL — ABNORMAL LOW (ref 0.35–5.50)

## 2023-01-27 NOTE — Progress Notes (Unsigned)
Name: [name]  MRN/ DOB: [id number], [birth year]    Age/ Sex: 62 y.o., female     PCP: Patient, No Pcp Per   Reason for Endocrinology Evaluation: Hyperthyroidism     Initial Endocrinology Clinic Visit: 05/25/2017    PATIENT IDENTIFIER: [name] is a 62 y.o., female with a past medical history of CHF, Hyperthyroidism, COPD, . She has followed with [facility] since 05/25/2017 for consultative assistance with management of her hyperthyroidism.   HISTORICAL SUMMARY: The patient was first diagnosed with hyperthyroidism  in 2018, this has been attributed to Graves' Lopez.  She has been on methimazole since her diagnosis   Cousin with thyroid Lopez [name])   She was follows by Dr. Loanne Drilling from    until 02/2022  I had ordered an ultrasound 08/2022 but by her visit in 12/2022 it has not been done  SUBJECTIVE:    Today (01/27/2023):  [name] is here for a follow upon Hyperthyroidism.  Pt with weight loss  Denies palpitations   Has been stressed due to work  Denies diarrhea or loose stools   Continues with local neck swelling   She has custody of her 7 yr grandson   Methimazole 5 mg, 2 tablets  daily   HISTORY:  Past Medical History:  Past Medical History:  Diagnosis Date  . CHF (congestive heart failure) ([place])   . Graves Lopez   . Herniated disc    Past Surgical History:  Past Surgical History:  Procedure Laterality Date  . back L4-5 herniation repair    . BUNIONECTOMY    . CARPAL TUNNEL RELEASE Right   . CATARACT EXTRACTION, BILATERAL    . plate and screws of R foot/arm     Social History:  reports that she has been smoking cigarettes. She started smoking about 45 years ago. She has a 43.00 pack-year smoking history. She has never used smokeless tobacco. She reports that she does not drink alcohol and does not use drugs. Family History:  Family History  Problem Relation Age of Onset  . Thyroid Lopez Neg Hx      HOME  MEDICATIONS: Allergies as of 01/27/2023       Reactions   Other Rash, Other (See Comments)   Per pt, an unnamed antibiotic used to treat MRSA caused a rash        Medication List        Accurate as of January 27, 2023  1:35 PM. If you have any questions, ask your nurse or doctor.          albuterol 108 (90 Base) MCG/ACT inhaler Commonly known as: VENTOLIN HFA Inhale 2 puffs into the lungs every 4 (four) hours as needed for wheezing or shortness of breath.   atorvastatin 10 MG tablet Commonly known as: LIPITOR Take 1 tablet (10 mg total) by mouth daily.   cetirizine 10 MG tablet Commonly known as: ZYRTEC Take 10 mg by mouth daily.   Excedrin Migraine 250-250-65 MG tablet Generic drug: aspirin-acetaminophen-caffeine Take 1 tablet by mouth every 6 (six) hours as needed for headache.   guaiFENesin 600 MG 12 hr tablet Commonly known as: MUCINEX Take 600 mg by mouth 2 (two) times daily as needed for to loosen phlegm or cough.   methimazole 5 MG tablet Commonly known as: TAPAZOLE Take 2 tablets (10 mg total) by mouth daily.   omeprazole 20 MG tablet Commonly known as: PRILOSEC OTC Take 20 mg by mouth See  admin instructions. Take 20 mg by mouth every morning before breakfast and an additional 20 mg at bedtime as needed for unresolved reflux   Vicks DayQuil Severe Cold/Flu 5-10-200-325 MG Caps Generic drug: Phenylephrine-DM-GG-APAP Take 1 capsule by mouth every 6 (six) hours as needed (for congestion/cold-like symptoms).          OBJECTIVE:   PHYSICAL EXAM: VS: BP 126/80 (BP Location: Left Arm, Patient Position: Sitting, Cuff Size: Large)   Pulse (!) 110   Ht 5' 4.02" (1.626 m)   Wt 163 lb (73.9 kg)   SpO2 99%   BMI 27.96 kg/m    EXAM: General: Pt appears well and is in NAD  Eyes: External eye exam normal without stare, lid lag or exophthalmos.  EOM intact.    Neck: General: Supple without adenopathy. Thyroid: Thyroid size normal.  Right thyroid nodule  palpated   Lungs: Clear with good BS bilat with no rales, rhonchi, or wheezes  Heart: Auscultation: RRR.  Abdomen: Normoactive bowel sounds, soft, nontender, without masses or organomegaly palpable  Extremities:  BL LE: No pretibial edema normal ROM and strength.  Mental Status: Judgment, insight: Intact Orientation: Oriented to time, place, and person Mood and affect: No depression, anxiety, or agitation     DATA REVIEWED:    Latest Reference Range & Units Most Recent 08/31/22 05:11  TSH 0.350 - 4.500 uIU/mL <0.010 (L) 08/30/22 21:45   Triiodothyronine (T3) 71 - 180 ng/dL 188 (H) 08/31/22 05:11 188 (H)  T4,Free(Direct) 0.61 - 1.12 ng/dL 3.01 (H) 08/31/22 05:11 3.01 (H)     Latest Reference Range & Units 08/31/22 05:11  Sodium 135 - 145 mmol/L 141  Potassium 3.5 - 5.1 mmol/L 3.7  Chloride 98 - 111 mmol/L 110  CO2 22 - 32 mmol/L 24  Glucose 70 - 99 mg/dL 191 (H)  BUN 6 - 20 mg/dL 12  Creatinine 0.44 - 1.00 mg/dL 0.33 (L)  Calcium 8.9 - 10.3 mg/dL 8.8 (L)  Anion gap 5 - 15  7  Phosphorus 2.5 - 4.6 mg/dL 3.7  Magnesium 1.7 - 2.4 mg/dL 2.0  Alkaline Phosphatase 38 - 126 U/L 80  Albumin 3.5 - 5.0 g/dL 3.2 (L)  AST 15 - 41 U/L 10 (L)  ALT 0 - 44 U/L 10  Total Protein 6.5 - 8.1 g/dL 6.5  Total Bilirubin 0.3 - 1.2 mg/dL 0.3  GFR, Estimated >60 mL/min >60    Latest Reference Range & Units 08/31/22 05:11  WBC 4.0 - 10.5 K/uL 7.7  RBC 3.87 - 5.11 MIL/uL 3.75 (L)  Hemoglobin 12.0 - 15.0 g/dL 11.0 (L)  HCT 36.0 - 46.0 % 33.9 (L)  MCV 80.0 - 100.0 fL 90.4  MCH 26.0 - 34.0 pg 29.3  MCHC 30.0 - 36.0 g/dL 32.4  RDW 11.5 - 15.5 % 12.7  Platelets 150 - 400 K/uL 255  nRBC 0.0 - 0.2 % 0.0     ASSESSMENT / PLAN / RECOMMENDATIONS:   Hyperthyroidism :  -Patient is biochemically hyperthyroid  -She assures me compliance -This has been attributed to Graves' Lopez based on clinical suspicion, no TRAb in the past -We discussed increasing methimazole as below -Patient is not  interested in radioactive iodine ablation or surgical intervention at this time, she has custody of her 60-year-old grandson and he is a high needs child and is unable to quarantine from him   Medications   Increase methimazole 5 mg 2 tabs daily  2. Thyromegaly    - Right neck nodules noted on exam  today  -We will proceed with thyroid ultrasound, this could be the main reason for hyperthyroid    Follow-up in 4 months Labs in 4 weeks   Signed electronically by: Mack Guise, MD  [facility]  [address] [place], [place] [address] Phone: [phone] FAX: [phone]      CC: Patient, No Pcp Per No address on file Phone: None  Fax: None   Return to Endocrinology clinic as below: No future appointments.

## 2023-01-28 LAB — T3: T3, Total: 202 ng/dL — ABNORMAL HIGH (ref 76–181)

## 2023-01-28 MED ORDER — METHIMAZOLE 5 MG PO TABS: 10.0000 mg | ORAL_TABLET | Freq: Three times a day (TID) | ORAL | 3 refills | Status: DC

## 2023-03-01 ENCOUNTER — Ambulatory Visit
Admission: RE | Admit: 2023-03-01 | Discharge: 2023-03-01 | Disposition: A | Discharge status: Home / Self Care | Payer: No Typology Code available for payment source | Source: Ambulatory Visit | Attending: Internal Medicine | Admitting: Internal Medicine

## 2023-03-01 DIAGNOSIS — E010 Iodine-deficiency related diffuse (endemic) goiter: Secondary | ICD-10-CM

## 2023-03-01 DIAGNOSIS — E0590 Thyrotoxicosis, unspecified without thyrotoxic crisis or storm: Secondary | ICD-10-CM

## 2023-08-13 ENCOUNTER — Ambulatory Visit: Payer: No Typology Code available for payment source | Admitting: Internal Medicine

## 2023-08-13 ENCOUNTER — Encounter: Payer: Self-pay | Admitting: Internal Medicine

## 2023-08-13 VITALS — BP 136/82 | HR 73 | Ht 64.02 in | Wt 169.0 lb

## 2023-08-13 DIAGNOSIS — Z23 Encounter for immunization: Secondary | ICD-10-CM

## 2023-08-13 DIAGNOSIS — E0590 Thyrotoxicosis, unspecified without thyrotoxic crisis or storm: Secondary | ICD-10-CM | POA: Diagnosis not present

## 2023-08-13 LAB — TSH: TSH: 2.18 u[IU]/mL (ref 0.35–5.50)

## 2023-08-13 LAB — T3, FREE: T3, Free: 3.8 pg/mL (ref 2.3–4.2)

## 2023-08-13 LAB — T4, FREE: Free T4: 0.6 ng/dL (ref 0.60–1.60)

## 2023-08-13 NOTE — Progress Notes (Unsigned)
Name: TYJAH MULCARE  MRN/ DOB: [id number], 03-Jun-1961    Age/ Sex: 62 y.o., female     PCP: Patient, No Pcp Per   Reason for Endocrinology Evaluation: Hyperthyroidism     Initial Endocrinology Clinic Visit: 05/25/2017    PATIENT IDENTIFIER: Ms. GEARLEAN CURNUTTE is a 62 y.o., female with a past medical history of CHF, Hyperthyroidism, COPD, . She has followed with [facility] since 05/25/2017 for consultative assistance with management of her hyperthyroidism.   HISTORICAL SUMMARY: The patient was first diagnosed with hyperthyroidism  in 2018, this has been attributed to Graves' disease.  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 Everardo All from    until 02/2022  I had ordered an ultrasound 08/2022 but by her visit in 12/2022 it has not been done  SUBJECTIVE:    Today (08/13/2023):  Ms. Diefenbach is here for a follow upon Hyperthyroidism.  Patient has been noted with weight gain Occasionally  palpitations   Denies tremors  Denies diarrhea or loose stools   Stable neck size    Methimazole 5 mg, 2 tablets QAM and 1 tab QPM  HISTORY:  Past Medical History:  Past Medical History:  Diagnosis Date   CHF (congestive heart failure) (HCC)    Graves disease    Herniated disc    Past Surgical History:  Past Surgical History:  Procedure Laterality Date   back L4-5 herniation repair     BUNIONECTOMY     CARPAL TUNNEL RELEASE Right    CATARACT EXTRACTION, BILATERAL     plate and screws of R foot/arm     Social History:  reports that she has been smoking cigarettes. She started smoking about 45 years ago. She has a 45.7 pack-year smoking history. She has never used smokeless tobacco. She reports that she does not drink alcohol and does not use drugs. Family History:  Family History  Problem Relation Age of Onset   Thyroid disease Neg Hx      HOME MEDICATIONS: Allergies as of 08/13/2023        Reactions   Other Rash, Other (See Comments)   Per pt, an unnamed antibiotic used to treat MRSA caused a rash        Medication List        Accurate as of August 13, 2023  1:15 PM. If you have any questions, ask your nurse or doctor.          albuterol 108 (90 Base) MCG/ACT inhaler Commonly known as: VENTOLIN HFA Inhale 2 puffs into the lungs every 4 (four) hours as needed for wheezing or shortness of breath.   atorvastatin 10 MG tablet Commonly known as: LIPITOR Take 1 tablet (10 mg total) by mouth daily.   cetirizine 10 MG tablet Commonly known as: ZYRTEC Take 10 mg by mouth daily.   Excedrin Migraine 250-250-65 MG tablet Generic drug: aspirin-acetaminophen-caffeine Take 1 tablet by mouth every 6 (six) hours as needed for headache.   guaiFENesin 600 MG 12 hr tablet Commonly known as: MUCINEX Take 600 mg by mouth 2 (two) times daily as needed for to loosen phlegm or cough.   methimazole 5 MG tablet Commonly known as: TAPAZOLE Take 2 tablets (10 mg total) by mouth 3 (three) times daily.   omeprazole 20 MG tablet Commonly known as: PRILOSEC OTC Take 20 mg by mouth See admin instructions. Take 20 mg by mouth  every morning before breakfast and an additional 20 mg at bedtime as needed for unresolved reflux   Vicks DayQuil Severe Cold/Flu 5-10-200-325 MG Caps Generic drug: Phenylephrine-DM-GG-APAP Take 1 capsule by mouth every 6 (six) hours as needed (for congestion/cold-like symptoms).          OBJECTIVE:   PHYSICAL EXAM: VS: BP 136/82 (BP Location: Left Arm, Patient Position: Sitting, Cuff Size: Small)   Pulse 73   Ht 5' 4.02" (1.626 m)   Wt 169 lb (76.7 kg)   SpO2 96%   BMI 28.99 kg/m    EXAM: General: Pt appears well and is in NAD  Eyes: External eye exam normal without stare, lid lag or exophthalmos.  EOM intact.    Neck: General: Supple without adenopathy. Thyroid: Thyroid size normal.  Right thyroid asymmetry noted  Lungs: Clear with good  BS bilat   Heart: Auscultation: RRR.  Extremities:  BL LE: No pretibial edema   Mental Status: Judgment, insight: Intact Orientation: Oriented to time, place, and person Mood and affect: No depression, anxiety, or agitation     DATA REVIEWED: ****   Latest Reference Range & Units 08/31/22 05:11  Sodium 135 - 145 mmol/L 141  Potassium 3.5 - 5.1 mmol/L 3.7  Chloride 98 - 111 mmol/L 110  CO2 22 - 32 mmol/L 24  Glucose 70 - 99 mg/dL 130 (H)  BUN 6 - 20 mg/dL 12  Creatinine 8.65 - 7.84 mg/dL 6.96 (L)  Calcium 8.9 - 10.3 mg/dL 8.8 (L)  Anion gap 5 - 15  7  Phosphorus 2.5 - 4.6 mg/dL 3.7  Magnesium 1.7 - 2.4 mg/dL 2.0  Alkaline Phosphatase 38 - 126 U/L 80  Albumin 3.5 - 5.0 g/dL 3.2 (L)  AST 15 - 41 U/L 10 (L)  ALT 0 - 44 U/L 10  Total Protein 6.5 - 8.1 g/dL 6.5  Total Bilirubin 0.3 - 1.2 mg/dL 0.3  GFR, Estimated >29 mL/min >60    Latest Reference Range & Units 08/31/22 05:11  WBC 4.0 - 10.5 K/uL 7.7  RBC 3.87 - 5.11 MIL/uL 3.75 (L)  Hemoglobin 12.0 - 15.0 g/dL 52.8 (L)  HCT 41.3 - 24.4 % 33.9 (L)  MCV 80.0 - 100.0 fL 90.4  MCH 26.0 - 34.0 pg 29.3  MCHC 30.0 - 36.0 g/dL 01.0  RDW 27.2 - 53.6 % 12.7  Platelets 150 - 400 K/uL 255  nRBC 0.0 - 0.2 % 0.0     Thyroid Ultrasound 03/01/2023 Estimated total number of nodules >/= 1 cm: 0   Number of spongiform nodules >/=  2 cm not described below (TR1): 0   Number of mixed cystic and solid nodules >/= 1.5 cm not described below (TR2): 0   _________________________________________________________   Moderate thyroid heterogeneity and diffuse enlargement. Findings compatible with medical thyroid disease such as Graves disease in the setting of hyperthyroidism. No hypervascularity. Negative for nodule or focal abnormality. No regional adenopathy.   IMPRESSION: 1. Thyroid enlargement and heterogeneity compatible with medical thyroid disease. 2. Negative for nodule.       ASSESSMENT / PLAN / RECOMMENDATIONS:    Hyperthyroidism :  -Patient is clinically euthyroid  -This has been attributed to Graves' disease based on clinical suspicion, no TRAb in the past -Patient is not interested in radioactive iodine ablation or surgical intervention at this time, she has custody of her 43-year-old grandson and he is a high needs child and is unable to quarantine from him - Pt is biochemically  hyperthyroid  Medications  Increase methimazole 5 mg 2 tabs QAM and 1 tab QPM      Follow-up in 4 months Labs in 4 weeks   Signed electronically by: Lyndle Herrlich, MD  [facility]  [facility] [address] [place] [place], [place] [address] Phone: [phone] FAX: [phone]      CC: Patient, No Pcp Per No address on file Phone: None  Fax: None   Return to Endocrinology clinic as below: No future appointments.

## 2023-08-16 MED ORDER — METHIMAZOLE 5 MG PO TABS
5.0000 mg | ORAL_TABLET | ORAL | 3 refills | Status: DC
Start: 2023-08-16 — End: 2024-02-14

## 2024-02-11 ENCOUNTER — Encounter: Payer: Self-pay | Admitting: Internal Medicine

## 2024-02-11 ENCOUNTER — Ambulatory Visit: Payer: No Typology Code available for payment source | Admitting: Internal Medicine

## 2024-02-11 VITALS — BP 132/78 | HR 74 | Ht 64.0 in | Wt 172.0 lb

## 2024-02-11 DIAGNOSIS — E0590 Thyrotoxicosis, unspecified without thyrotoxic crisis or storm: Secondary | ICD-10-CM

## 2024-02-11 NOTE — Progress Notes (Signed)
 Name: [name]  MRN/ DOB: [id number], Dec 24, 1960    Age/ Sex: 63 y.o., female     PCP: Patient, No Pcp Per   Reason for Endocrinology Evaluation: Hyperthyroidism     Initial Endocrinology Clinic Visit: 05/25/2017    PATIENT IDENTIFIER: [name] is a 63 y.o., female with a past medical history of CHF, Hyperthyroidism, COPD, . She has followed with [facility] since 05/25/2017 for consultative assistance with management of her hyperthyroidism.   HISTORICAL SUMMARY: The patient was first diagnosed with hyperthyroidism  in 2018, this has been attributed to Graves' disease.  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 Everardo All from    until 02/2022    SUBJECTIVE:    Today (02/11/2024):  [name] is here for a follow upon Hyperthyroidism.  Weight has trended up  Continues with occasional   palpitations   Denies tremors  Denies diarrhea or loose stools   Stable neck size  Denies anxiety or jittery sensation  Has occasional watering of the eyes   Methimazole 5 mg 2 tabs QAM and 1 tab QPM Monday through Friday, and 2 tabs Saturday and Sunday    HISTORY:  Past Medical History:  Past Medical History:  Diagnosis Date   CHF (congestive heart failure) (HCC)    Graves disease    Herniated disc    Past Surgical History:  Past Surgical History:  Procedure Laterality Date   back L4-5 herniation repair     BUNIONECTOMY     CARPAL TUNNEL RELEASE Right    CATARACT EXTRACTION, BILATERAL     plate and screws of R foot/arm     Social History:  reports that she has been smoking cigarettes. She started smoking about 46 years ago. She has a 46.2 pack-year smoking history. She has never used smokeless tobacco. She reports that she does not drink alcohol and does not use drugs. Family History:  Family History  Problem Relation Age of Onset   Thyroid disease Neg Hx      HOME  MEDICATIONS: Allergies as of 02/11/2024       Reactions   Other Rash, Other (See Comments)   Per pt, an unnamed antibiotic used to treat MRSA caused a rash        Medication List        Accurate as of February 11, 2024  7:33 AM. If you have any questions, ask your nurse or doctor.          albuterol 108 (90 Base) MCG/ACT inhaler Commonly known as: VENTOLIN HFA Inhale 2 puffs into the lungs every 4 (four) hours as needed for wheezing or shortness of breath.   atorvastatin 10 MG tablet Commonly known as: LIPITOR Take 1 tablet (10 mg total) by mouth daily.   cetirizine 10 MG tablet Commonly known as: ZYRTEC Take 10 mg by mouth daily.   Excedrin Migraine 250-250-65 MG tablet Generic drug: aspirin-acetaminophen-caffeine Take 1 tablet by mouth every 6 (six) hours as needed for headache.   guaiFENesin 600 MG 12 hr tablet Commonly known as: MUCINEX Take 600 mg by mouth 2 (two) times daily as needed for to loosen phlegm or cough.   methimazole 5 MG tablet Commonly known as: TAPAZOLE Take 1 tablet (5 mg total) by mouth as directed. 3 tabs Monday through Friday and 2 tabs on Saturday and Sunday   omeprazole 20 MG tablet  Commonly known as: PRILOSEC OTC Take 20 mg by mouth See admin instructions. Take 20 mg by mouth every morning before breakfast and an additional 20 mg at bedtime as needed for unresolved reflux   Vicks DayQuil Severe Cold/Flu 5-10-200-325 MG Caps Generic drug: Phenylephrine-DM-GG-APAP Take 1 capsule by mouth every 6 (six) hours as needed (for congestion/cold-like symptoms).          OBJECTIVE:   PHYSICAL EXAM: VS: There were no vitals taken for this visit.   EXAM: General: Pt appears well and is in NAD  Eyes: External eye exam normal without stare, lid lag or exophthalmos.  EOM intact.    Neck: General: Supple without adenopathy. Thyroid: Thyroid size normal.  Right thyroid asymmetry noted  Lungs: Clear with good BS bilat   Heart: Auscultation:  RRR.  Extremities:  BL LE: No pretibial edema   Mental Status: Judgment, insight: Intact Orientation: Oriented to time, place, and person Mood and affect: No depression, anxiety, or agitation     DATA REVIEWED:  Latest Reference Range & Units 02/11/24 13:24  TSH 0.40 - 4.50 mIU/L 4.50  T4,Free(Direct) 0.8 - 1.8 ng/dL 0.8     Latest Reference Range & Units 08/31/22 05:11  Sodium 135 - 145 mmol/L 141  Potassium 3.5 - 5.1 mmol/L 3.7  Chloride 98 - 111 mmol/L 110  CO2 22 - 32 mmol/L 24  Glucose 70 - 99 mg/dL 782 (H)  BUN 6 - 20 mg/dL 12  Creatinine 9.56 - 2.13 mg/dL 0.86 (L)  Calcium 8.9 - 10.3 mg/dL 8.8 (L)  Anion gap 5 - 15  7  Phosphorus 2.5 - 4.6 mg/dL 3.7  Magnesium 1.7 - 2.4 mg/dL 2.0  Alkaline Phosphatase 38 - 126 U/L 80  Albumin 3.5 - 5.0 g/dL 3.2 (L)  AST 15 - 41 U/L 10 (L)  ALT 0 - 44 U/L 10  Total Protein 6.5 - 8.1 g/dL 6.5  Total Bilirubin 0.3 - 1.2 mg/dL 0.3  GFR, Estimated >57 mL/min >60    Latest Reference Range & Units 08/31/22 05:11  WBC 4.0 - 10.5 K/uL 7.7  RBC 3.87 - 5.11 MIL/uL 3.75 (L)  Hemoglobin 12.0 - 15.0 g/dL 84.6 (L)  HCT 96.2 - 95.2 % 33.9 (L)  MCV 80.0 - 100.0 fL 90.4  MCH 26.0 - 34.0 pg 29.3  MCHC 30.0 - 36.0 g/dL 84.1  RDW 32.4 - 40.1 % 12.7  Platelets 150 - 400 K/uL 255  nRBC 0.0 - 0.2 % 0.0     Thyroid Ultrasound 03/01/2023 Estimated total number of nodules >/= 1 cm: 0   Number of spongiform nodules >/=  2 cm not described below (TR1): 0   Number of mixed cystic and solid nodules >/= 1.5 cm not described below (TR2): 0   _________________________________________________________   Moderate thyroid heterogeneity and diffuse enlargement. Findings compatible with medical thyroid disease such as Graves disease in the setting of hyperthyroidism. No hypervascularity. Negative for nodule or focal abnormality. No regional adenopathy.   IMPRESSION: 1. Thyroid enlargement and heterogeneity compatible with medical thyroid disease. 2.  Negative for nodule.       ASSESSMENT / PLAN / RECOMMENDATIONS:   Hyperthyroidism :  -Patient is clinically euthyroid  -This has been attributed to Graves' disease based on clinical suspicion, no TRAb in the past -Patient is not interested in radioactive iodine ablation or surgical intervention at this time, she has custody of her 55-year-old grandson and he is a high needs child and is unable to quarantine from him -  TFTs show elevated TSH, will decrease Methimazole  Medications   Decrease methimazole 5 mg 2 tabs daily    Follow-up in 6 months    Signed electronically by: Lyndle Herrlich, MD  [facility]  [facility] [address] [place] [place], [place] [address] Phone: [phone] FAX: [phone]      CC: Patient, No Pcp Per No address on file Phone: None  Fax: None   Return to Endocrinology clinic as below: Future Appointments  Date Time Provider Department Center  02/11/2024  1:00 PM Johnay Mano, Konrad Dolores, MD [facility] None

## 2024-02-12 LAB — T4, FREE: Free T4: 0.8 ng/dL (ref 0.8–1.8)

## 2024-02-12 LAB — TSH: TSH: 4.5 m[IU]/L (ref 0.40–4.50)

## 2024-02-14 ENCOUNTER — Encounter: Payer: Self-pay | Admitting: Internal Medicine

## 2024-02-14 ENCOUNTER — Other Ambulatory Visit: Payer: Self-pay

## 2024-02-14 DIAGNOSIS — E0590 Thyrotoxicosis, unspecified without thyrotoxic crisis or storm: Secondary | ICD-10-CM

## 2024-02-14 MED ORDER — METHIMAZOLE 5 MG PO TABS
10.0000 mg | ORAL_TABLET | Freq: Every day | ORAL | 3 refills | Status: DC
Start: 2024-02-14 — End: 2024-08-16

## 2024-02-14 MED ORDER — METHIMAZOLE 5 MG PO TABS: 5.0000 mg | ORAL_TABLET | ORAL | 3 refills | Status: DC

## 2024-08-15 ENCOUNTER — Ambulatory Visit: Admitting: Internal Medicine

## 2024-08-15 ENCOUNTER — Encounter: Payer: Self-pay | Admitting: Internal Medicine

## 2024-08-15 VITALS — BP 120/80 | HR 77 | Ht 64.0 in | Wt 164.0 lb

## 2024-08-15 DIAGNOSIS — Z23 Encounter for immunization: Secondary | ICD-10-CM

## 2024-08-15 DIAGNOSIS — E0590 Thyrotoxicosis, unspecified without thyrotoxic crisis or storm: Secondary | ICD-10-CM | POA: Diagnosis not present

## 2024-08-15 LAB — COMPREHENSIVE METABOLIC PANEL WITH GFR
AG Ratio: 2.4 (calc) (ref 1.0–2.5)
ALT: 9 U/L (ref 6–29)
AST: 12 U/L (ref 10–35)
Albumin: 4.3 g/dL (ref 3.6–5.1)
Alkaline phosphatase (APISO): 80 U/L (ref 37–153)
BUN: 12 mg/dL (ref 7–25)
CO2: 28 mmol/L (ref 20–32)
Calcium: 8.7 mg/dL (ref 8.6–10.4)
Chloride: 104 mmol/L (ref 98–110)
Creat: 0.55 mg/dL (ref 0.50–1.05)
Globulin: 1.8 g/dL — ABNORMAL LOW (ref 1.9–3.7)
Glucose, Bld: 93 mg/dL (ref 65–99)
Potassium: 3.5 mmol/L (ref 3.5–5.3)
Sodium: 140 mmol/L (ref 135–146)
Total Bilirubin: 0.3 mg/dL (ref 0.2–1.2)
Total Protein: 6.1 g/dL (ref 6.1–8.1)
eGFR: 104 mL/min/1.73m2 (ref >=60)

## 2024-08-15 LAB — TSH: TSH: 2.55 m[IU]/L (ref 0.40–4.50)

## 2024-08-15 LAB — CBC
HCT: 37.7 % (ref 35.0–45.0)
Hemoglobin: 12.8 g/dL (ref 11.7–15.5)
MCH: 32.6 pg (ref 27.0–33.0)
MCHC: 34 g/dL (ref 32.0–36.0)
MCV: 95.9 fL (ref 80.0–100.0)
MPV: 10.5 fL (ref 7.5–12.5)
Platelets: 251 Thousand/uL (ref 140–400)
RBC: 3.93 Million/uL (ref 3.80–5.10)
RDW: 11.4 % (ref 11.0–15.0)
WBC: 9.8 Thousand/uL (ref 3.8–10.8)

## 2024-08-15 LAB — T4, FREE: Free T4: 0.8 ng/dL (ref 0.8–1.8)

## 2024-08-15 NOTE — Progress Notes (Unsigned)
 Name: [name]  MRN/ DOB: [id number], [birth year]    Age/ Sex: 63 y.o., female     PCP: Patient, No Pcp Per   Reason for [facility] Evaluation: Hyperthyroidism     Initial [facility] Visit: 05/25/2017    PATIENT IDENTIFIER: [name] is a 63 y.o., female with a past medical history of CHF, Hyperthyroidism, COPD, . She has followed with [name] since 05/25/2017 for consultative assistance with management of her hyperthyroidism.   HISTORICAL SUMMARY: The patient was first diagnosed with hyperthyroidism  in 2018, this has been attributed to Graves' disease.  She has been on methimazole  since her diagnosis   Cousin with thyroid  disease ETTER Gavel' Disease)   She was follows by Dr. Kassie from  2018 until 02/2022    SUBJECTIVE:    Today (08/16/2024):  [name] is here for a follow upon Hyperthyroidism.  Pt has been noted with weight loss  No local neck swelling  No tremors  Has occasional palpitations No constipation or diarrhea   Has occasional burning and itching of the eyes, past due for an eye exam   Methimazole  5 mg 2 tabs QAM     HISTORY:  Past Medical History:  Past Medical History:  Diagnosis Date   CHF (congestive heart failure) (HCC)    Graves disease    Herniated disc    Past Surgical History:  Past Surgical History:  Procedure Laterality Date   back L4-5 herniation repair     BUNIONECTOMY     CARPAL TUNNEL RELEASE Right    CATARACT EXTRACTION, BILATERAL     plate and screws of R foot/arm     Social History:  reports that she has been smoking cigarettes. She started smoking about 46 years ago. She has a 46.7 pack-year smoking history. She has never used smokeless tobacco. She reports that she does not drink alcohol and does not use drugs. Family History:  Family History  Problem Relation Age of Onset   Thyroid  disease Neg Hx      HOME MEDICATIONS: Allergies as of 08/15/2024       Reactions   Other Rash,  Other (See Comments)   Per pt, an unnamed antibiotic used to treat MRSA caused a rash        Medication List        Accurate as of August 15, 2024 11:59 PM. If you have any questions, ask your nurse or doctor.          albuterol  108 (90 Base) MCG/ACT inhaler Commonly known as: VENTOLIN  HFA Inhale 2 puffs into the lungs every 4 (four) hours as needed for wheezing or shortness of breath.   atorvastatin  10 MG tablet Commonly known as: LIPITOR Take 1 tablet (10 mg total) by mouth daily.   cetirizine 10 MG tablet Commonly known as: ZYRTEC Take 10 mg by mouth daily.   Excedrin Migraine 250-250-65 MG tablet Generic drug: aspirin -acetaminophen -caffeine Take 1 tablet by mouth every 6 (six) hours as needed for headache.   methimazole  5 MG tablet Commonly known as: TAPAZOLE  Take 2 tablets (10 mg total) by mouth daily. Change methimazole  5 mg 2 tabs QAM and 1 tab QPM Monday through Friday, and 2 tabs Saturday and Sunday   omeprazole  20 MG tablet Commonly known as: PRILOSEC  OTC Take 20 mg by mouth See admin instructions. Take 20 mg by mouth every morning before breakfast and an additional 20 mg at bedtime as needed for unresolved reflux  OBJECTIVE:   PHYSICAL EXAM: VS: BP 120/80 (BP Location: Left Arm, Patient Position: Sitting, Cuff Size: Normal)   Pulse 77   Ht 5' 4 (1.626 m)   Wt 164 lb (74.4 kg)   SpO2 96%   BMI 28.15 kg/m    EXAM: General: Pt appears well and is in NAD  Eyes: External eye exam normal without stare, lid lag or exophthalmos.  EOM intact.    Neck: General: Supple without adenopathy. Thyroid : Thyroid  size normal.  Right thyroid  asymmetry noted  Lungs: Clear with good BS bilat   Heart: Auscultation: RRR.  Extremities:  BL LE: No pretibial edema   Mental Status: Judgment, insight: Intact Orientation: Oriented to time, place, and person Mood and affect: No depression, anxiety, or agitation     DATA REVIEWED:   Latest Reference  Range & Units 08/15/24 13:24  Sodium 135 - 146 mmol/L 140  Potassium 3.5 - 5.3 mmol/L 3.5  Chloride 98 - 110 mmol/L 104  CO2 20 - 32 mmol/L 28  Glucose 65 - 99 mg/dL 93  BUN 7 - 25 mg/dL 12  Creatinine 9.49 - 8.94 mg/dL 9.44  Calcium  8.6 - 10.4 mg/dL 8.7  BUN/Creatinine Ratio 6 - 22 (calc) SEE NOTE:  eGFR > OR = 60 mL/min/1.35m2 104  AG Ratio 1.0 - 2.5 (calc) 2.4  AST 10 - 35 U/L 12  ALT 6 - 29 U/L 9  Total Protein 6.1 - 8.1 g/dL 6.1  Total Bilirubin 0.2 - 1.2 mg/dL 0.3    Latest Reference Range & Units 08/15/24 13:24  WBC 3.8 - 10.8 Thousand/uL 9.8  RBC 3.80 - 5.10 Million/uL 3.93  Hemoglobin 11.7 - 15.5 g/dL 87.1  HCT 64.9 - 54.9 % 37.7  MCV 80.0 - 100.0 fL 95.9  MCH 27.0 - 33.0 pg 32.6  MCHC 32.0 - 36.0 g/dL 65.9  RDW 88.9 - 84.9 % 11.4  Platelets 140 - 400 Thousand/uL 251  MPV 7.5 - 12.5 fL 10.5    Latest Reference Range & Units 08/15/24 13:24  TSH 0.40 - 4.50 mIU/L 2.55  T4,Free(Direct) 0.8 - 1.8 ng/dL 0.8    Thyroid  Ultrasound 03/01/2023 Estimated total number of nodules >/= 1 cm: 0   Number of spongiform nodules >/=  2 cm not described below (TR1): 0   Number of mixed cystic and solid nodules >/= 1.5 cm not described below (TR2): 0   _________________________________________________________   Moderate thyroid  heterogeneity and diffuse enlargement. Findings compatible with medical thyroid  disease such as Graves disease in the setting of hyperthyroidism. No hypervascularity. Negative for nodule or focal abnormality. No regional adenopathy.   IMPRESSION: 1. Thyroid  enlargement and heterogeneity compatible with medical thyroid  disease. 2. Negative for nodule.       ASSESSMENT / PLAN / RECOMMENDATIONS:   Hyperthyroidism :  - Patient is clinically euthyroid -No local neck symptoms -This has been attributed to Graves' disease based on clinical suspicion, no TRAb in the past -Patient is not interested in radioactive iodine ablation or surgical  intervention at this time, she has custody of her grandson and he is a high needs child and is unable to quarantine from him -Patient encouraged to have an updated eye exam - TFTs are within normal range, but we have room to decrease methimazole    Medications   Change methimazole  5 mg 2 tabs Monday through Friday, 1 tablet Saturday and 1 tablet Sunday   Follow-up in 6 months   Pt encouraged  to establish with PCP  Signed electronically by: Stefano Redgie Butts, MD  [facility]  [facility] [address] [place], [place] [address] Phone: [phone] FAX: [phone]      CC: Patient, No Pcp Per No address on file Phone: None  Fax: None   Return to [facility] clinic as below: Future Appointments  Date Time Provider Department Center  02/12/2025  1:00 PM [name], Donell Redgie, MD [facility] None

## 2024-08-16 ENCOUNTER — Ambulatory Visit: Payer: Self-pay | Admitting: Internal Medicine

## 2024-08-16 MED ORDER — METHIMAZOLE 5 MG PO TABS: 10.0000 mg | ORAL_TABLET | ORAL | 3 refills | Status: AC

## 2024-08-16 NOTE — Telephone Encounter (Signed)
 Please contact the patient let her know that her thyroid  test results are within normal range and we do have room to decrease methimazole      Please change methimazole  by taking 2 tablets  daily Monday through Friday, and 1 tablet on Saturdays and 1 tablet on Sundays    Thanks

## 2025-02-12 ENCOUNTER — Ambulatory Visit: Admitting: Internal Medicine
